# Patient Record
Sex: Male | Born: 1955 | Race: White | Hispanic: No | Marital: Married | Smoking: Never smoker
Health system: Southern US, Community
[De-identification: ages and names within clinical notes are randomized; demographics above are authoritative.]

## PROBLEM LIST (undated history)

## (undated) DIAGNOSIS — Z800 Family history of malignant neoplasm of digestive organs: Secondary | ICD-10-CM

## (undated) DIAGNOSIS — G4730 Sleep apnea, unspecified: Secondary | ICD-10-CM

## (undated) DIAGNOSIS — Z87442 Personal history of urinary calculi: Secondary | ICD-10-CM

## (undated) DIAGNOSIS — N529 Male erectile dysfunction, unspecified: Secondary | ICD-10-CM

## (undated) DIAGNOSIS — L989 Disorder of the skin and subcutaneous tissue, unspecified: Secondary | ICD-10-CM

## (undated) DIAGNOSIS — Z8719 Personal history of other diseases of the digestive system: Secondary | ICD-10-CM

## (undated) DIAGNOSIS — N401 Enlarged prostate with lower urinary tract symptoms: Secondary | ICD-10-CM

## (undated) DIAGNOSIS — I4891 Unspecified atrial fibrillation: Secondary | ICD-10-CM

## (undated) DIAGNOSIS — D369 Benign neoplasm, unspecified site: Secondary | ICD-10-CM

## (undated) DIAGNOSIS — T8859XA Other complications of anesthesia, initial encounter: Secondary | ICD-10-CM

## (undated) DIAGNOSIS — N210 Calculus in bladder: Secondary | ICD-10-CM

## (undated) DIAGNOSIS — K760 Fatty (change of) liver, not elsewhere classified: Secondary | ICD-10-CM

## (undated) DIAGNOSIS — I700 Atherosclerosis of aorta: Secondary | ICD-10-CM

## (undated) DIAGNOSIS — D1803 Hemangioma of intra-abdominal structures: Secondary | ICD-10-CM

## (undated) DIAGNOSIS — N138 Other obstructive and reflux uropathy: Secondary | ICD-10-CM

## (undated) HISTORY — PX: BACK SURGERY: SHX140

## (undated) HISTORY — PX: ESOPHAGOGASTRODUODENOSCOPY: SHX1529

---

## 2001-05-24 HISTORY — PX: BACK SURGERY: SHX140

## 2001-06-09 ENCOUNTER — Ambulatory Visit (HOSPITAL_COMMUNITY): Admission: RE | Admit: 2001-06-09 | Discharge: 2001-06-09 | Payer: Self-pay | Admitting: Pulmonary Disease

## 2001-06-26 ENCOUNTER — Ambulatory Visit (HOSPITAL_COMMUNITY): Admission: RE | Admit: 2001-06-26 | Discharge: 2001-06-27 | Payer: Self-pay | Admitting: Neurosurgery

## 2006-02-14 ENCOUNTER — Ambulatory Visit (HOSPITAL_COMMUNITY): Admission: RE | Admit: 2006-02-14 | Discharge: 2006-02-14 | Payer: Self-pay | Admitting: Internal Medicine

## 2006-02-14 ENCOUNTER — Ambulatory Visit: Payer: Self-pay | Admitting: Internal Medicine

## 2007-05-16 ENCOUNTER — Ambulatory Visit: Payer: Self-pay | Admitting: Cardiology

## 2007-05-24 ENCOUNTER — Ambulatory Visit: Payer: Self-pay

## 2010-10-06 NOTE — Assessment & Plan Note (Signed)
[facility]                            CARDIOLOGY OFFICE NOTE   NAME:Domangue, RILLEY STASH                       MRN:          [id number]  DATE:05/16/2007                            DOB:          1955-12-06    CHIEF COMPLAINT:  Chest tightness and tingling in my left arm.   HISTORY PRESENT ILLNESSES:  Mr. Tobechukwu Emmick is a delightful 55 year old  married white male from [place], who has been having some tightness  in his upper left chest that goes into his left arm.  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   He does not exercise on a regular basis.  In fact, he is exercising very  little during the winter days.  He does do yard work and does not  complain of any major exertional symptoms.   His cardiac risk factors included age, sex, and a low HDL in the 30-32  range.   PAST MEDICAL HISTORY:  he does not smoke, does not use recreational  products, does not drink.   He has no allergies.  His only medicine is a multivitamin.   SURGERY:  Ruptured disk in 2003.   FAMILY HISTORY:  Negative for any premature heart disease in first-  degree relatives.   SOCIAL HISTORY:  Is married, has no children.  He in the banking  business, and has been in [place] for 20 years.   REVIEW OF SYSTEMS:  Totally negative other than the HPI.   EXAM:  His blood pressure is 130/79.  His pulse is 72 and regular.  His  EKG is normal.  He is 6 feet 3 inches and weighs 215.  HEENT:  Normocephalic, atraumatic.  PERRL.  Extraocular is intact.  Sclerae are clear.  Face symmetry is normal.  Carotids are equal bilaterally bruits, no JVD.  Thyroid is not enlarged.  Trachea is midline.  LUNGS:  Clear.  HEART:  Reveals regular rate and rhythm with a nondisplaced PMI.  ABDOMEN:  Soft, good bowel sounds.  No midline bruit.  EXTREMITIES:  No cyanosis, clubbing or edema.  Pulses  intact.  NEURO:  Exam is intact.  Skin shows multiple cherry angiomas, otherwise negative.   ASSESSMENT:  Chest tightness with tenting in his left arm.  He has  several cardiac risk factors.  He needs to get into a regular exercise  program.   PLAN:  Exercise rest/stress Myoview next week.  He is off that week.  It  will work out well with his schedule.     Thomas C. Daleen Squibb, MD, [facility]  Electronically Signed    TCW/MedQ  DD: 05/16/2007  DT: 05/16/2007  Job #: [id number]   cc:   Ramon Dredge L. [name], M.D.

## 2010-10-09 NOTE — Op Note (Signed)
[place]  Patient:    [name], [name] Visit Number: [id number] MRN: [id number]          Service Type: DSU Location: 3000 [id number] [id number] Attending Physician:  [name] Dictated by:   [name], M.D. Proc. Date: 06/26/01 Admit Date:  06/26/2001                             Operative Report  PREOPERATIVE DIAGNOSES:  Left L4-5 herniated nucleus pulposus, degenerative disk disease, lumbago, lumbar radiculopathy.  POSTOPERATIVE DIAGNOSES:  Left L4-5 herniated nucleus pulposus, degenerative disk disease, lumbago, lumbar radiculopathy.  PROCEDURE:  Left L4-5 microdiskectomy using microdissection.  SURGEON:  [name], M.D.  ANESTHESIA:  General endotracheal.  ESTIMATED BLOOD LOSS:  Less than 100 cc.  SPECIMENS:  None.  DRAINS:  None.  COMPLICATIONS:  None.  BRIEF HISTORY:  The patient is a 55 year old white male who has suffered from back and left leg pain.  He failed medical management and was worked up with a lumbar MRI, which demonstrated a left L4-5 herniated nucleus pulposus.  The patients signs, symptoms, and physical exam were consistent with a left L5 radiculopathy.  I discussed the various treatment options with him, including surgery, and the patient weighed the risks, benefits, and alternatives of surgery and decided to proceed with a microdiskectomy.  DESCRIPTION OF PROCEDURE:  The patient was brought to the operating room by the anesthesia team.  General endotracheal anesthesia was induced.  The patient was then turned to the prone position on the Wilson frame, and his lumbosacral region was then shaved and prepared with Betadine scrub and Betadine solution.  Sterile drapes were applied.  I infiltrated the area to be incised with Marcaine with epinephrine solution and then used a scalpel to make a vertical midline incision over the L4-5 interspace.  I used electrocautery to dissect down to the  thoracolumbar fascia and divide the fascia just to the left of midline and performed a left-sided subperiosteal dissection, stripping the paraspinous musculature from the left spinous process and lamina of L4 and L5.  I inserted the McCullough retractor for exposure and then obtained the intraoperative radiograph to confirm our location.  I then brought the operating microscope into the field and under its magnification and illumination, I completed a microdissection/ decompression.  I performed a left L4 laminotomy using the high-speed drill. I then widened the laminotomy with the Kerrison punch, removing the left L4-5 ligamentum flavum.  I then performed a foraminotomy about the left L5 nerve root and then used microdissection to free up the nerve root from the epidural tissue.  I gently retracted medially with the DErrico retractor.  This exposed the underlying free-fragment herniated disk, which I removed in several fragments using the pituitary forceps.  I then used the nerve hook to prove about the annulus fibrosus.  There was a small hole where the disk had ruptured through, but I did not feel any further disk herniation, and at this point the thecal sac and nerve root were well-decompressed and I therefore did violate the annulus fibrosus.  I palpated along the ventral surface of the thecal sac and the exit route of the L5 nerve root and noted that the neural structures were well-decompressed.  I then achieved stringent hemostasis using bipolar electrocautery.  I then copiously irrigated the wound out with bacitracin solution, removed the solution, and then removed the McCullough retractor.  I  then reapproximated the patients thoracolumbar fascia with interrupted #1 Vicryl suture, the subcutaneous tissue with 0 Vicryl suture, the skin with Steri-Strips and benzoin.  The wound was then coated with bacitracin ointment and sterile dressings applied.  The drapes were removed. The  patient was subsequently returned to the supine position, where he was extubated by the anesthesia team and transported to the postanesthesia care unit in stable condition.  All sponge, instrument, and needle counts were correct at the end of the case. Dictated by:   [name], M.D. Attending Physician:  Tressie Stalker D DD:  06/26/01 TD:  06/27/01 Job: [id number]

## 2010-10-09 NOTE — Op Note (Signed)
[name], [name]                ACCOUNT NO.:  [id number]   MEDICAL RECORD NO.:  [id number]          PATIENT TYPE:  AMB   LOCATION:  DAY                           FACILITY:  [facility]   PHYSICIAN:  Lionel December, M.D.    DATE OF BIRTH:  [birth year]   DATE OF PROCEDURE:  02/14/2006  DATE OF DISCHARGE:                                 OPERATIVE REPORT   PROCEDURE:  Colonoscopy.   INDICATIONS FOR PROCEDURE:  [name] is a 55 year old Caucasian male who is  undergoing high risk screening colonoscopy.  Family history is positive for  colon carcinoma in his father.  Maternal grandmother also had colon  carcinoma.  Chadd does not have any GI symptoms.  The procedure risks were  reviewed with the patient and informed consent was obtained.   MEDICATIONS FOR CONSCIOUS SEDATION:  Demerol 50 mg IV, Versed 8 mg IV.   FINDINGS:  The procedure is performed in the endoscopy suite.  The patient's  vital signs and O2 saturations were monitored during the procedure and  remained stable.  The patient was placed in the left lateral position and  rectal examination performed.  No abnormality noted on external or digital  exam.  The Olympus videoscope was placed in the rectum and advanced under  vision in the sigmoid colon and beyond.  The preparation was excellent.  The  scope was passed to the cecum which was identified by the appendiceal  orifice and ileocecal valve.  Pictures were taken for the record.  As the  scope was withdrawn, colonic mucosa was carefully examined and there were no  mucosal abnormalities or diverticular changes noted.  The rectal mucosa,  similarly, was normal.  The scope was retroflexed to examine the anorectal  junction and small hemorrhoids noted below the dentate line.  The endoscope  was straightened and withdrawn.  The patient tolerated the procedure well.   FINAL DIAGNOSIS:  Normal colonoscopy except small external hemorrhoids.   RECOMMENDATIONS:  1. Standard instructions  given.  2. Yearly hemoccults.  3. Screening exam five years from now.      Lionel December, M.D.  Electronically Signed    NR/MEDQ  D:  02/14/2006  T:  02/15/2006  Job:  [id number]   cc:   Ramon Dredge L. [name], M.D.  Fax: [phone]

## 2011-01-26 ENCOUNTER — Encounter (INDEPENDENT_AMBULATORY_CARE_PROVIDER_SITE_OTHER): Payer: Self-pay | Admitting: *Deleted

## 2011-02-10 ENCOUNTER — Telehealth (INDEPENDENT_AMBULATORY_CARE_PROVIDER_SITE_OTHER): Payer: Self-pay | Admitting: *Deleted

## 2011-02-10 NOTE — Telephone Encounter (Signed)
Patient called today and left voice mail at 12:21pm.  Time for 5 year repeat TCS.

## 2011-02-19 NOTE — Telephone Encounter (Signed)
Called patient several times but no answer & no answering machine to leave message

## 2011-04-08 ENCOUNTER — Telehealth (INDEPENDENT_AMBULATORY_CARE_PROVIDER_SITE_OTHER): Payer: Self-pay | Admitting: *Deleted

## 2011-04-08 ENCOUNTER — Encounter (INDEPENDENT_AMBULATORY_CARE_PROVIDER_SITE_OTHER): Payer: Self-pay | Admitting: *Deleted

## 2011-04-08 ENCOUNTER — Other Ambulatory Visit (INDEPENDENT_AMBULATORY_CARE_PROVIDER_SITE_OTHER): Payer: Self-pay | Admitting: *Deleted

## 2011-04-08 DIAGNOSIS — Z800 Family history of malignant neoplasm of digestive organs: Secondary | ICD-10-CM

## 2011-04-08 MED ORDER — PEG-KCL-NACL-NASULF-NA ASC-C 100 G PO SOLR: 1.0000 | Freq: Once | ORAL | Status: DC

## 2011-04-08 NOTE — Telephone Encounter (Signed)
Patient needs movi prep 

## 2011-04-22 ENCOUNTER — Telehealth (INDEPENDENT_AMBULATORY_CARE_PROVIDER_SITE_OTHER): Payer: Self-pay | Admitting: *Deleted

## 2011-04-22 NOTE — Telephone Encounter (Signed)
PCP/Requesting MD:    Name: [name]  DOB: April 14, 2056  Home Phone: [phone]      Procedure: TCS  Reason/Indication:  SURVEILLANCE, + FH CRC  Has patient had this procedure before?  YES  If so, when, by whom and where?  2007  Is there a family history of colon cancer?  YES  Who?  What age when diagnosed?  FATHER  Is patient diabetic?   NO      Does patient have prosthetic heart valve?  NO  Do you have a pacemaker?  NO  Has patient had joint replacement within last 12 months?  NO  Is patient on Coumadin, Plavix and/or Aspirin? NO  Medications: MULTI VIT, LEVASA PRN  Allergies: NKDA  Pharmacy: [facility]  Medication Adjustment: NONE  Procedure date & time: 05/13/11 @ 3:00

## 2011-04-26 NOTE — Telephone Encounter (Signed)
agree

## 2011-05-23 ENCOUNTER — Observation Stay (HOSPITAL_COMMUNITY)
Admission: EM | Admit: 2011-05-23 | Discharge: 2011-05-24 | DRG: 143 | Disposition: A | Discharge status: Home / Self Care | Payer: BC Managed Care – PPO | Attending: Pulmonary Disease | Admitting: Pulmonary Disease

## 2011-05-23 ENCOUNTER — Other Ambulatory Visit: Payer: Self-pay

## 2011-05-23 ENCOUNTER — Emergency Department (HOSPITAL_COMMUNITY): Payer: BC Managed Care – PPO

## 2011-05-23 DIAGNOSIS — E785 Hyperlipidemia, unspecified: Secondary | ICD-10-CM | POA: Diagnosis present

## 2011-05-23 DIAGNOSIS — R0789 Other chest pain: Principal | ICD-10-CM | POA: Diagnosis present

## 2011-05-23 LAB — CBC
HCT: 42.9 % (ref 39.0–52.0)
Hemoglobin: 14.7 g/dL (ref 13.0–17.0)
MCV: 87.2 fL (ref 78.0–100.0)
RBC: 4.92 MIL/uL (ref 4.22–5.81)
WBC: 8.1 10*3/uL (ref 4.0–10.5)

## 2011-05-23 LAB — COMPREHENSIVE METABOLIC PANEL
AST: 20 U/L (ref 0–37)
CO2: 27 mEq/L (ref 19–32)
Calcium: 9.7 mg/dL (ref 8.4–10.5)
Chloride: 103 mEq/L (ref 96–112)
Creatinine, Ser: 1.11 mg/dL (ref 0.50–1.35)
GFR calc Af Amer: 85 mL/min — ABNORMAL LOW (ref >90)
GFR calc non Af Amer: 73 mL/min — ABNORMAL LOW (ref >90)
Glucose, Bld: 174 mg/dL — ABNORMAL HIGH (ref 70–99)
Total Bilirubin: 0.5 mg/dL (ref 0.3–1.2)

## 2011-05-23 LAB — MRSA PCR SCREENING: MRSA by PCR: NEGATIVE

## 2011-05-23 LAB — DIFFERENTIAL
Eosinophils Relative: 2 % (ref 0–5)
Lymphocytes Relative: 25 % (ref 12–46)
Lymphs Abs: 2 10*3/uL (ref 0.7–4.0)
Monocytes Absolute: 0.6 10*3/uL (ref 0.1–1.0)
Monocytes Relative: 7 % (ref 3–12)
Neutro Abs: 5.3 10*3/uL (ref 1.7–7.7)

## 2011-05-23 LAB — POCT I-STAT TROPONIN I

## 2011-05-23 MED ORDER — NITROGLYCERIN 0.4 MG SL SUBL
0.4000 mg | SUBLINGUAL_TABLET | SUBLINGUAL | Status: DC | PRN
  Administered 2011-05-23: 0.4 mg via SUBLINGUAL
  Filled 2011-05-23: qty 25

## 2011-05-23 MED ORDER — ENOXAPARIN SODIUM 40 MG/0.4ML ~~LOC~~ SOLN
40.0000 mg | SUBCUTANEOUS | Status: DC
  Administered 2011-05-23: 40 mg via SUBCUTANEOUS
  Filled 2011-05-23 (×3): qty 0.4

## 2011-05-23 MED ORDER — TRAMADOL HCL 50 MG PO TABS
50.0000 mg | ORAL_TABLET | Freq: Four times a day (QID) | ORAL | Status: DC | PRN
  Administered 2011-05-23: 50 mg via ORAL
  Filled 2011-05-23: qty 1

## 2011-05-23 MED ORDER — ASPIRIN 81 MG PO CHEW
81.0000 mg | CHEWABLE_TABLET | Freq: Every day | ORAL | Status: DC
  Administered 2011-05-24: 81 mg via ORAL
  Filled 2011-05-23: qty 1

## 2011-05-23 MED ORDER — SIMVASTATIN 20 MG PO TABS
20.0000 mg | ORAL_TABLET | Freq: Every day | ORAL | Status: DC
  Filled 2011-05-23 (×2): qty 1

## 2011-05-23 MED ORDER — LORAZEPAM 0.5 MG PO TABS
0.5000 mg | ORAL_TABLET | Freq: Four times a day (QID) | ORAL | Status: DC | PRN
  Administered 2011-05-24: 0.5 mg via ORAL
  Filled 2011-05-23: qty 1

## 2011-05-23 MED ORDER — SODIUM CHLORIDE 0.9 % IV SOLN
INTRAVENOUS | Status: DC
  Administered 2011-05-23: 20:00:00 via INTRAVENOUS

## 2011-05-23 MED ORDER — ASPIRIN 81 MG PO CHEW
324.0000 mg | CHEWABLE_TABLET | Freq: Once | ORAL | Status: AC
  Administered 2011-05-23: 324 mg via ORAL
  Filled 2011-05-23: qty 4

## 2011-05-23 MED ORDER — OXYCODONE-ACETAMINOPHEN 5-325 MG PO TABS
1.0000 | ORAL_TABLET | Freq: Once | ORAL | Status: AC
  Administered 2011-05-23: 1 via ORAL
  Filled 2011-05-23: qty 1

## 2011-05-23 NOTE — ED Provider Notes (Signed)
History     CSN: [id number]  Arrival date & time 05/23/11  1337   First MD Initiated Contact with Patient 05/23/11 1349      Chief Complaint  Patient presents with  . Chest Pain    (Consider location/radiation/quality/duration/timing/severity/associated sxs/prior treatment) Patient is a 55 y.o. male presenting with chest pain. The history is provided by the patient (the patient states he has been having chest pain off and on for a couple days now but it has been more constant since yesterday , the patient states that he has had some sweating and shortness of breath).  Chest Pain The chest pain began 12 - 24 hours ago. Chest pain occurs constantly. The chest pain is unchanged. Associated with: lying down. At its most intense, the pain is at 7/10. The pain is currently at 3/10. The quality of the pain is described as aching. The pain does not radiate. Chest pain is worsened by certain positions. Pertinent negatives for primary symptoms include no fever, no fatigue, no cough and no abdominal pain.  Associated symptoms include diaphoresis.  Pertinent negatives for associated symptoms include no claudication. He tried nothing for the symptoms. Risk factors include no known risk factors.  Pertinent negatives for past medical history include no aneurysm and no seizures.     History reviewed. No pertinent past medical history.  Past Surgical History  Procedure Date  . Back surgery     No family history on file.  History  Substance Use Topics  . Smoking status: Never Smoker   . Smokeless tobacco: Not on file  . Alcohol Use: No      Review of Systems  Constitutional: Positive for diaphoresis. Negative for fever and fatigue.  HENT: Negative for congestion, sinus pressure and ear discharge.   Eyes: Negative for discharge.  Respiratory: Negative for cough.   Cardiovascular: Positive for chest pain. Negative for claudication.  Gastrointestinal: Negative for abdominal pain and  diarrhea.  Genitourinary: Negative for frequency and hematuria.  Musculoskeletal: Negative for back pain.  Skin: Negative for rash.  Neurological: Negative for seizures and headaches.  Hematological: Negative.   Psychiatric/Behavioral: Negative for hallucinations.    Allergies  Review of patient's allergies indicates no known allergies.  Home Medications   Current Outpatient Rx  Name Route Sig Dispense Refill  . PEG-KCL-NACL-NASULF-NA ASC-C 100 G PO SOLR Oral Take 1 kit (100 g total) by mouth once. 1 kit 0    BP 122/70  Pulse 104  Temp(Src) 98.3 F (36.8 C) (Oral)  Resp 20  Ht 6\' 3"  (1.905 m)  Wt 225 lb (102.059 kg)  BMI 28.12 kg/m2  SpO2 96%  Physical Exam  Constitutional: He is oriented to person, place, and time. He appears well-developed.  HENT:  Head: Normocephalic and atraumatic.  Eyes: Conjunctivae and EOM are normal. No scleral icterus.  Neck: Neck supple. No thyromegaly present.  Cardiovascular: Normal rate and regular rhythm.  Exam reveals no gallop and no friction rub.   No murmur heard. Pulmonary/Chest: No stridor. He has no wheezes. He has no rales. He exhibits no tenderness.  Abdominal: He exhibits no distension. There is no tenderness. There is no rebound.  Musculoskeletal: Normal range of motion. He exhibits no edema.  Lymphadenopathy:    He has no cervical adenopathy.  Neurological: He is oriented to person, place, and time. Coordination normal.  Skin: No rash noted. No erythema.  Psychiatric: He has a normal mood and affect. His behavior is normal.    ED Course  Procedures (including critical care time)  Labs Reviewed  COMPREHENSIVE METABOLIC PANEL - Abnormal; Notable for the following:    Glucose, Bld 174 (*)    GFR calc non Af Amer 73 (*)    GFR calc Af Amer 85 (*)    All other components within normal limits  CBC  DIFFERENTIAL  POCT I-STAT TROPONIN I  I-STAT TROPONIN I   Dg Chest 2 View  05/23/2011  *RADIOLOGY REPORT*  Clinical Data:  Chest pain, shortness of breath  CHEST - 2 VIEW  Comparison: None.  Findings: Cardiomediastinal silhouette is unremarkable.  Mild elevation of the left hemidiaphragm.  Moderate gaseous distention of proximal stomach.  Mild left basilar atelectasis.  No acute infiltrate or pulmonary edema.  IMPRESSION: Mild left basilar atelectasis.  No acute infiltrate or pulmonary edema.  Moderate gaseous distention of proximal stomach.  Original Report Authenticated By: Natasha Mead, M.D.     1. Chest pain     Date: 05/23/2011  Rate: 80  Rhythm: normal sinus rhythm  QRS Axis: normal  Intervals: normal  ST/T Wave abnormalities: nonspecific ST changes  Conduction Disutrbances:none  Narrative Interpretation:   Old EKG Reviewed: none available     MDM          Benny Lennert, MD 05/23/11 [phone]

## 2011-05-23 NOTE — ED Notes (Signed)
Pt presents with left sided chest pain that radiates to left shoulder. Pt states pain started 2 days ago. Pt denies anything worsening pain.

## 2011-05-23 NOTE — H&P (Signed)
[name], [name]                ACCOUNT NO.:  [id number]  MEDICAL RECORD NO.:  [id number]  LOCATION:  IC03                          FACILITY:  [facility]  PHYSICIAN:  Melvyn Novas, MDDATE OF BIRTH:  [birth year]  DATE OF ADMISSION:  05/23/2011 DATE OF DISCHARGE:  LH                             HISTORY & PHYSICAL   The patient is 55 year old white married male, patient of Dr. [name], who has risk factor for coronary disease, hyperlipidemia, questionably compliant with Lovaza.  Basically complains of 3 night history of left arm aching and pain, which progressed to 36 hours ago, left-sided pectoral pain which was occurring at rest, questionable mild pleuritic component, but patient denies any exertional component.  The patient was seen in the ER by Dr. Estell Harpin, given sublingual nitroglycerin and partial relief.  EKG and enzymes were essentially unremarkable, however due to one risk factor and questionable borderline hypothyroidism, we will admit and do serial enzymes and consider functional evaluation in a day or so.  The patient admits to some associated nausea with the chest pain, but denies diaphoresis, palpitations, dizziness, or syncope.  PAST MEDICAL HISTORY:  Significant for hyperlipidemia, borderline elevation of TSH, and herniated nucleus pulposus of lumbosacral spine.  PAST SURGICAL HISTORY:  Remarkable for laminectomy of lumbosacral spine.  He has no known allergies.  His current medicines are Lovaza, unknown dosage once a day.  SOCIAL HISTORY:  He is married, works in a bank.  Does not smoke. Occasionally imbibes alcohol.  REVIEW OF SYSTEMS:  Negative for polyuria, polydipsia, weight loss, seizures, tremors, syncope, orthopnea, or PND.  PHYSICAL EXAMINATION:  VITAL SIGNS:  Blood pressure at present is 122/70, temperature 98.3, pulse is 104 and regular, respiratory rate is 20, O2 sat is 96%. EYES:  PERRLA intact.  Sclerae clear.  Conjunctivae pink.  NECK:   No JVD.  No carotid bruits.  No thyromegaly.  No thyroid bruits. LUNGS:  Clear to A and P.  No rales, wheeze, or rhonchi appreciable. HEART:  Regular rhythm.  No S3, S4, or gallops.  No heaves, thrills, or rubs. ABDOMEN:  Soft, nontender.  Bowel sounds normoactive.  No guarding, rebound, mass, or hepatosplenomegaly.  EXTREMITIES:  No clubbing, cyanosis, or edema. NEUROLOGIC:  Cranial nerves II-XII grossly intact.  Patient moves all 4 extremities.  Chest x-ray reveals mild left basilar atelectasis.  No acute infiltrate.  WBC 8.1, hemoglobin 14.7.  The plan at present is to add aspirin 81 mg per day, add Lovenox, add simvastatin 20 mg p.o. now.  We will get serial cardiac enzymes q.8 hours x3, EKG in a.m., Cardiology consult for possible early functional evaluation if they deem clinically necessary.  He will be admitted to the ICU.     Melvyn Novas, MD     RMD/MEDQ  D:  05/23/2011  T:  05/23/2011  Job:  [id number]

## 2011-05-23 NOTE — H&P (Signed)
[id number] 

## 2011-05-24 DIAGNOSIS — R072 Precordial pain: Secondary | ICD-10-CM

## 2011-05-24 DIAGNOSIS — R079 Chest pain, unspecified: Secondary | ICD-10-CM

## 2011-05-24 LAB — CARDIAC PANEL(CRET KIN+CKTOT+MB+TROPI)
CK, MB: 2.2 ng/mL (ref 0.3–4.0)
Relative Index: 1.8 (ref 0.0–2.5)
Relative Index: 2 (ref 0.0–2.5)
Total CK: 137 U/L (ref 7–232)
Troponin I: <0.3 ng/mL (ref <0.30)

## 2011-05-24 LAB — GLUCOSE, CAPILLARY
Glucose-Capillary: 94 mg/dL (ref 70–99)
Glucose-Capillary: 71 mg/dL (ref 70–99)

## 2011-05-24 MED ORDER — NAPROXEN SODIUM 220 MG PO TABS: 220.0000 mg | ORAL_TABLET | Freq: Two times a day (BID) | ORAL | Status: AC

## 2011-05-24 MED ORDER — ENOXAPARIN SODIUM 60 MG/0.6ML ~~LOC~~ SOLN: 50.0000 mg | SUBCUTANEOUS | Status: DC

## 2011-05-24 MED ORDER — ONDANSETRON HCL 4 MG/2ML IJ SOLN
4.0000 mg | Freq: Four times a day (QID) | INTRAMUSCULAR | Status: DC | PRN
  Administered 2011-05-24: 4 mg via INTRAVENOUS
  Filled 2011-05-24: qty 2

## 2011-05-24 MED ORDER — KETOROLAC TROMETHAMINE 60 MG/2ML IM SOLN
60.0000 mg | Freq: Once | INTRAMUSCULAR | Status: AC
  Administered 2011-05-24: 60 mg via INTRAMUSCULAR

## 2011-05-24 MED ORDER — NAPROXEN 250 MG PO TABS: 375.0000 mg | ORAL_TABLET | Freq: Three times a day (TID) | ORAL | Status: DC

## 2011-05-24 MED ORDER — KETOROLAC TROMETHAMINE 30 MG/ML IJ SOLN
INTRAMUSCULAR | Status: AC
  Administered 2011-05-24: 60 mg
  Filled 2011-05-24: qty 2

## 2011-05-24 MED ORDER — LORAZEPAM 0.5 MG PO TABS: 1.0000 mg | ORAL_TABLET | Freq: Every evening | ORAL | Status: DC | PRN

## 2011-05-24 NOTE — Progress Notes (Signed)
Discharge instructions given. PT STATES THAT SHARP CHEST PAIN IS RELIEVED. IV D/C'D. VITAL SIGNS ARE STABLE. DISCHARGED HOME ACCOMPAINED BY WIFE. TRANSPORTED IN W/C.

## 2011-05-24 NOTE — Consult Note (Signed)
Patient Name: [name]  MRN: [id number]  HPI: DELSIN COPEN is an 55 y.o. male referred for consultation by Dr.Edward Patrice Paradise, MD for chest pain.   This nice gentleman has enjoyed generally excellent health. His only cardiovascular risk factor is hyperlipidemia that has been treated pharmacologically.  He was evaluated by Dr. Daleen Squibb 5 years ago with a stress test that was reportedly negative when he was experiencing chest discomfort. Activity level is not high, but he experiences no dyspnea nor chest discomfort with yard work or other similar exertion.  Onset of chest discomfort was approximately 36 hours before dimension. It has waxed and waned, but remained present virtually throughout. Quality is pressure like and sharp. Intensity has been mild to moderate. There is a pleuritic component and some chest wall tenderness. Patient reports no recent unaccustomed activity or apparent injury. He has not used over-the-counter analgesics. He has not identified anything that improves discomfort. Movement of the trunk exacerbates it.  History reviewed. No pertinent past medical history except as noted above.  Past Surgical History  Procedure Date  . Back surgery     History reviewed. No pertinent family history.  Social History:  reports that he has never smoked. He does not have any smokeless tobacco history on file. He reports that he does not drink alcohol or use illicit drugs.  Allergies: No Known Allergies  Medications: I have reviewed the patient's current medications.  Results for orders placed during the hospital encounter of 05/23/11 (from the past 48 hour(s))  CBC     Status: Normal   Collection Time   05/23/11  2:25 PM      Component Value Range Comment   WBC 8.1  4.0 - 10.5 (K/uL)    RBC 4.92  4.22 - 5.81 (MIL/uL)    Hemoglobin 14.7  13.0 - 17.0 (g/dL)    HCT 40.9  81.1 - 91.4 (%)    MCV 87.2  78.0 - 100.0 (fL)    MCH 29.9  26.0 - 34.0 (pg)    MCHC 34.3  30.0 - 36.0 (g/dL)     RDW 78.2  95.6 - 21.3 (%)    Platelets 231  150 - 400 (K/uL)   DIFFERENTIAL     Status: Normal   Collection Time   05/23/11  2:25 PM      Component Value Range Comment   Neutrophils Relative 65  43 - 77 (%)    Neutro Abs 5.3  1.7 - 7.7 (K/uL)    Lymphocytes Relative 25  12 - 46 (%)    Lymphs Abs 2.0  0.7 - 4.0 (K/uL)    Monocytes Relative 7  3 - 12 (%)    Monocytes Absolute 0.6  0.1 - 1.0 (K/uL)    Eosinophils Relative 2  0 - 5 (%)    Eosinophils Absolute 0.2  0.0 - 0.7 (K/uL)    Basophils Relative 0  0 - 1 (%)    Basophils Absolute 0.0  0.0 - 0.1 (K/uL)   COMPREHENSIVE METABOLIC PANEL     Status: Abnormal   Collection Time   05/23/11  2:25 PM      Component Value Range Comment   Sodium 140  135 - 145 (mEq/L)    Potassium 3.6  3.5 - 5.1 (mEq/L)    Chloride 103  96 - 112 (mEq/L)    CO2 27  19 - 32 (mEq/L)    Glucose, Bld 174 (*) 70 - 99 (mg/dL)    BUN  16  6 - 23 (mg/dL)    Creatinine, Ser 0.45  0.50 - 1.35 (mg/dL)    Calcium 9.7  8.4 - 10.5 (mg/dL)    Total Protein 7.4  6.0 - 8.3 (g/dL)    Albumin 3.8  3.5 - 5.2 (g/dL)    AST 20  0 - 37 (U/L)    ALT 27  0 - 53 (U/L)    Alkaline Phosphatase 67  39 - 117 (U/L)    Total Bilirubin 0.5  0.3 - 1.2 (mg/dL)    GFR calc non Af Amer 73 (*) >90 (mL/min)    GFR calc Af Amer 85 (*) >90 (mL/min)   POCT I-STAT TROPONIN I     Status: Normal   Collection Time   05/23/11  2:31 PM      Component Value Range Comment   Troponin i, poc 0.00  0.00 - 0.08 (ng/mL)    Comment 3            MRSA PCR SCREENING     Status: Normal   Collection Time   05/23/11  7:20 PM      Component Value Range Comment   MRSA by PCR NEGATIVE  NEGATIVE    CARDIAC PANEL(CRET KIN+CKTOT+MB+TROPI)     Status: Normal   Collection Time   05/24/11 12:18 AM      Component Value Range Comment   Total CK 137  7 - 232 (U/L)    CK, MB 2.4  0.3 - 4.0 (ng/mL)    Troponin I <0.30  <0.30 (ng/mL)    Relative Index 1.8  0.0 - 2.5    GLUCOSE, CAPILLARY     Status: Normal    Collection Time   05/24/11  7:21 AM      Component Value Range Comment   Glucose-Capillary 94  70 - 99 (mg/dL)   CARDIAC PANEL(CRET KIN+CKTOT+MB+TROPI)     Status: Normal   Collection Time   05/24/11  7:46 AM      Component Value Range Comment   Total CK 112  7 - 232 (U/L)    CK, MB 2.2  0.3 - 4.0 (ng/mL)    Troponin I <0.30  <0.30 (ng/mL)    Relative Index 2.0  0.0 - 2.5    D-DIMER, QUANTITATIVE     Status: Normal   Collection Time   05/24/11  7:46 AM      Component Value Range Comment   D-Dimer, Quant 0.39  0.00 - 0.48 (ug/mL-FEU)   GLUCOSE, CAPILLARY     Status: Normal   Collection Time   05/24/11 11:13 AM      Component Value Range Comment   Glucose-Capillary 71  70 - 99 (mg/dL)    EKG: Normal sinus rhythm. Delayed R-wave progression, otherwise within normal limits.  Dg Chest 2 View  05/23/2011  *RADIOLOGY REPORT*  Clinical Data: Chest pain, shortness of breath  CHEST - 2 VIEW  Comparison: None.  Findings: Cardiomediastinal silhouette is unremarkable.  Mild elevation of the left hemidiaphragm.  Moderate gaseous distention of proximal stomach.  Mild left basilar atelectasis.  No acute infiltrate or pulmonary edema.  IMPRESSION: Mild left basilar atelectasis.  No acute infiltrate or pulmonary edema.  Moderate gaseous distention of proximal stomach.  Original Report Authenticated By: Natasha Mead, M.D.    Review of Systems: General: no anorexia, weight gain or weight loss Cardiac: no chest pain, dyspnea, orthopnea, PND,  or syncope Respiratory: no cough, sputum production or hemoptysis GI: no nausea, abdominal pain,  emesis, diarrhea or constipation Integument: no significant lesions Neurologic: No muscle weakness or paralysis; no speech disturbance; no headache  Physical Exam: Blood pressure 120/69, pulse 73, temperature 98 F (36.7 C), temperature source Oral, resp. rate 18, height 6\' 3"  (1.905 m), weight 103 kg (227 lb 1.2 oz), SpO2 95.00%.  General-Well-developed; no acute  distress HEENT-J[place]/AT; PERRL; EOM intact; conjunctiva and lids nl Neck-No JVD; no carotid bruits Endocrine-No thyromegaly Lungs-Clear lung fields; resonant percussion; normal I-to-E ratio Cardiovascular- normal PMI; normal S1 and S2 Abdomen-BS normal; soft and non-tender without masses or organomegaly Musculoskeletal-No deformities, cyanosis or clubbing Neurologic-Nl cranial nerves; symmetric strength and tone Skin- Warm, no significant lesions Extremities-Nl distal pulses; no edema  Assessment/Plan:  Pain is pleuritic and consistent with a musculoskeletal etiology.  Discomfort has no characteristics to suggest a myocardial origin.  Echocardiogram shows no segmental wall motion abnormality, so the EKG findings suggestive of possible septal myocardial infarction are likely related to lead placement or cardiac position within the chest. With a normal d-dimer, pulmonary embolism is highly unlikely. Nonsteroidal analgesics will be added to his medical regime. From a cardiac standpoint, he does not require additional time in hospital.  Evant Bing, MD 05/24/2011, 1:02 PM

## 2011-05-24 NOTE — Progress Notes (Signed)
Subjective: He was brought in last night with chest discomfort. This is been going on for several days. Initially it was pressure-like and aching but has become more pleuritic as time has gone. It is somewhat positional. He has not had any exertional angina. He has had a couple of episodes similar to this in the past but not as severe and not as persistent.  Objective: Vital signs in last 24 hours: Temp:  [98 F (36.7 C)-98.5 F (36.9 C)] 98 F (36.7 C) (12/31 0400) Pulse Rate:  [67-104] 73  (12/31 0600) Resp:  [14-26] 18  (12/31 0600) BP: (102-131)/(55-88) 120/69 mmHg (12/31 0600) SpO2:  [94 %-99 %] 95 % (12/31 0600) Weight:  [102.059 kg (225 lb)-103 kg (227 lb 1.2 oz)] 227 lb 1.2 oz (103 kg) (12/31 0500) Weight change:  Last BM Date: 05/22/11  Intake/Output from previous day: 12/30 0701 - 12/31 0700 In: 1248.3 [P.O.:240; I.V.:1008.3] Out: -   PHYSICAL EXAM General appearance: alert, cooperative and no distress Resp: clear to auscultation bilaterally Cardio: regular rate and rhythm, S1, S2 normal, no murmur, click, rub or gallop GI: soft, non-tender; bowel sounds normal; no masses,  no organomegaly Extremities: extremities normal, atraumatic, no cyanosis or edema  Lab Results:    Basic Metabolic Panel:  Basename 05/23/11 1425  NA 140  K 3.6  CL 103  CO2 27  GLUCOSE 174*  BUN 16  CREATININE 1.11  CALCIUM 9.7  MG --  PHOS --   Liver Function Tests:  Basename 05/23/11 1425  AST 20  ALT 27  ALKPHOS 67  BILITOT 0.5  PROT 7.4  ALBUMIN 3.8   No results found for this basename: LIPASE:2,AMYLASE:2 in the last 72 hours No results found for this basename: AMMONIA:2 in the last 72 hours CBC:  Basename 05/23/11 1425  WBC 8.1  NEUTROABS 5.3  HGB 14.7  HCT 42.9  MCV 87.2  PLT 231   Cardiac Enzymes:  Basename 05/24/11 0018  CKTOTAL 137  CKMB 2.4  CKMBINDEX --  TROPONINI <0.30   BNP: No results found for this basename: PROBNP:3 in the last 72  hours D-Dimer: No results found for this basename: DDIMER:2 in the last 72 hours CBG: No results found for this basename: GLUCAP:6 in the last 72 hours Hemoglobin A1C: No results found for this basename: HGBA1C in the last 72 hours Fasting Lipid Panel: No results found for this basename: CHOL,HDL,LDLCALC,TRIG,CHOLHDL,LDLDIRECT in the last 72 hours Thyroid Function Tests: No results found for this basename: TSH,T4TOTAL,FREET4,T3FREE,THYROIDAB in the last 72 hours Anemia Panel: No results found for this basename: VITAMINB12,FOLATE,FERRITIN,TIBC,IRON,RETICCTPCT in the last 72 hours Coagulation: No results found for this basename: LABPROT:2,INR:2 in the last 72 hours Urine Drug Screen: Drugs of Abuse  No results found for this basename: labopia, cocainscrnur, labbenz, amphetmu, thcu, labbarb    Alcohol Level: No results found for this basename: ETH:2 in the last 72 hours Urinalysis:  Misc. Labs:  ABGS No results found for this basename: PHART,PCO2,PO2ART,TCO2,HCO3 in the last 72 hours CULTURES Recent Results (from the past 240 hour(s))  MRSA PCR SCREENING     Status: Normal   Collection Time   05/23/11  7:20 PM      Component Value Range Status Comment   MRSA by PCR NEGATIVE  NEGATIVE  Final    Studies/Results: Dg Chest 2 View  05/23/2011  *RADIOLOGY REPORT*  Clinical Data: Chest pain, shortness of breath  CHEST - 2 VIEW  Comparison: None.  Findings: Cardiomediastinal silhouette is unremarkable.  Mild  elevation of the left hemidiaphragm.  Moderate gaseous distention of proximal stomach.  Mild left basilar atelectasis.  No acute infiltrate or pulmonary edema.  IMPRESSION: Mild left basilar atelectasis.  No acute infiltrate or pulmonary edema.  Moderate gaseous distention of proximal stomach.  Original Report Authenticated By: Natasha Mead, M.D.    Medications:  Prior to Admission:  Prescriptions prior to admission  Medication Sig Dispense Refill  . peg 3350 powder (MOVIPREP) 100  G SOLR Take 1 kit by mouth once. Colonoscopy scheduled for January...        Scheduled:   . aspirin  324 mg Oral Once  . aspirin  81 mg Oral Daily  . enoxaparin (LOVENOX) injection  40 mg Subcutaneous Q24H  . oxyCODONE-acetaminophen  1 tablet Oral Once  . simvastatin  20 mg Oral q1800   Continuous:   . sodium chloride 100 mL/hr at 05/24/11 0600   OZH:YQMVHQION, LORazepam, nitroGLYCERIN, ondansetron (ZOFRAN) IV, traMADol  Assesment: He has had chest pain with some atypical features but also some worrisome features for cardiac disease. He is still having pain and thus far his cardiac enzymes are negative. Active Problems:  * No active hospital problems. *     Plan: I will plan for him to have cardiology consultation. I'm going to have him get a d-dimer continue his other treatments.    LOS: 1 day   Tarrin Lebow L 05/24/2011, 7:42 AM

## 2011-05-24 NOTE — Progress Notes (Signed)
*  PRELIMINARY RESULTS* Echocardiogram 2D Echocardiogram has been performed.  [name] 05/24/2011, 11:37 AM

## 2011-05-26 NOTE — Discharge Summary (Signed)
Physician Discharge Summary  Patient ID: [name] MRN: [id number] DOB/AGE: [birth year] 56 y.o. Primary Care Physician:No primary provider on file. Admit date: 05/23/2011 Discharge date: 05/26/2011    Discharge Diagnoses:  Chest pain myocardial infarction ruled out Hyperlipidemia Active Problems:  * No active hospital problems. *    Discharge Medication List as of 05/24/2011  2:36 PM    START taking these medications   Details  naproxen sodium (ALEVE) 220 MG tablet Take 1 tablet (220 mg total) by mouth 2 (two) times daily with a meal., Starting 05/24/2011, Until Tue 05/23/12, OTC      STOP taking these medications     peg 3350 powder (MOVIPREP) 100 G SOLR         Discharged Condition: Improved    Consults: Cardiology  Significant Diagnostic Studies: Dg Chest 2 View  05/23/2011  *RADIOLOGY REPORT*  Clinical Data: Chest pain, shortness of breath  CHEST - 2 VIEW  Comparison: None.  Findings: Cardiomediastinal silhouette is unremarkable.  Mild elevation of the left hemidiaphragm.  Moderate gaseous distention of proximal stomach.  Mild left basilar atelectasis.  No acute infiltrate or pulmonary edema.  IMPRESSION: Mild left basilar atelectasis.  No acute infiltrate or pulmonary edema.  Moderate gaseous distention of proximal stomach.  Original Report Authenticated By: Natasha Mead, M.D.    Lab Results: Basic Metabolic Panel:  Basename 05/23/11 1425  NA 140  K 3.6  CL 103  CO2 27  GLUCOSE 174*  BUN 16  CREATININE 1.11  CALCIUM 9.7  MG --  PHOS --   Liver Function Tests:  Basename 05/23/11 1425  AST 20  ALT 27  ALKPHOS 67  BILITOT 0.5  PROT 7.4  ALBUMIN 3.8     CBC:  Basename 05/23/11 1425  WBC 8.1  NEUTROABS 5.3  HGB 14.7  HCT 42.9  MCV 87.2  PLT 231    Recent Results (from the past 240 hour(s))  MRSA PCR SCREENING     Status: Normal   Collection Time   05/23/11  7:20 PM      Component Value Range Status Comment   MRSA by PCR NEGATIVE   NEGATIVE  Final      Hospital Course: He was brought in because of chest pain that had typical and atypical features. He ruled out for myocardial infarction. He has consultation with the cardiology team and they felt that his pain was not cardiac in nature and that he could be treated as an outpatient and have further evaluations and outpatient.  Discharge Exam: Blood pressure 131/80, pulse 77, temperature 98 F (36.7 C), temperature source Oral, resp. rate 23, height 6\' 3"  (1.905 m), weight 103 kg (227 lb 1.2 oz), SpO2 97.00%. His chest was clear heart regular abdomen soft  Disposition: Home  Discharge Orders    Future Orders Please Complete By Expires   Discharge patient           Signed: Fredirick Maudlin Pager [phone]  05/26/2011, 7:31 AM

## 2011-06-01 ENCOUNTER — Telehealth (INDEPENDENT_AMBULATORY_CARE_PROVIDER_SITE_OTHER): Payer: Self-pay | Admitting: *Deleted

## 2011-06-01 NOTE — Telephone Encounter (Signed)
agree

## 2011-06-01 NOTE — Telephone Encounter (Signed)
Requesting MD:  [name]   PCP: [name]   Name: [name]  DOB: 10/11/2055   Procedure: TCS  Reason/Indication:  Surveillance, + FH CR   Has patient had this procedure before?  yes  If so, when, by whom and where?  2007  Is there a family history of colon cancer?  yes  Who?  What age when diagnosed?  father  Is patient diabetic?   no      Does patient have prosthetic heart valve?  no  Do you have a pacemaker?  no  Has patient had joint replacement within last 12 months?  no  Is patient on Coumadin, Plavix and/or Aspirin? no  Medications: multi vit, Levasa prn  Allergies: NKDA  Pharmacy: [facility]  Medication Adjustment: none  Other:   Procedure date & time: 06/23/11 @ 830

## 2011-06-01 NOTE — Telephone Encounter (Signed)
LM asking to speak with the person that files out the forms to send to insurance company after a tcs. The return phone number is [phone].

## 2011-06-15 ENCOUNTER — Encounter (HOSPITAL_COMMUNITY): Payer: Self-pay | Admitting: Pharmacy Technician

## 2011-06-16 ENCOUNTER — Encounter (INDEPENDENT_AMBULATORY_CARE_PROVIDER_SITE_OTHER): Payer: Self-pay | Admitting: *Deleted

## 2011-06-22 MED ORDER — SODIUM CHLORIDE 0.45 % IV SOLN
Freq: Once | INTRAVENOUS | Status: AC
Start: 2011-06-22 — End: 2011-06-23
  Administered 2011-06-23: 1000 mL via INTRAVENOUS

## 2011-06-23 ENCOUNTER — Other Ambulatory Visit (INDEPENDENT_AMBULATORY_CARE_PROVIDER_SITE_OTHER): Payer: Self-pay | Admitting: Internal Medicine

## 2011-06-23 ENCOUNTER — Encounter (HOSPITAL_COMMUNITY)
Admission: RE | Disposition: A | Discharge status: Home / Self Care | Payer: Self-pay | Source: Ambulatory Visit | Attending: Internal Medicine

## 2011-06-23 ENCOUNTER — Encounter (HOSPITAL_COMMUNITY): Payer: Self-pay | Admitting: *Deleted

## 2011-06-23 ENCOUNTER — Ambulatory Visit (HOSPITAL_COMMUNITY)
Admission: RE | Admit: 2011-06-23 | Discharge: 2011-06-23 | Disposition: A | Discharge status: Home / Self Care | Payer: BC Managed Care – PPO | Source: Ambulatory Visit | Attending: Internal Medicine | Admitting: Internal Medicine

## 2011-06-23 DIAGNOSIS — D126 Benign neoplasm of colon, unspecified: Secondary | ICD-10-CM | POA: Insufficient documentation

## 2011-06-23 DIAGNOSIS — Z800 Family history of malignant neoplasm of digestive organs: Secondary | ICD-10-CM | POA: Insufficient documentation

## 2011-06-23 DIAGNOSIS — Z7982 Long term (current) use of aspirin: Secondary | ICD-10-CM | POA: Insufficient documentation

## 2011-06-23 DIAGNOSIS — Z1211 Encounter for screening for malignant neoplasm of colon: Secondary | ICD-10-CM

## 2011-06-23 HISTORY — PX: COLONOSCOPY: SHX5424

## 2011-06-23 SURGERY — COLONOSCOPY
Anesthesia: Moderate Sedation

## 2011-06-23 MED ORDER — MEPERIDINE HCL 50 MG/ML IJ SOLN
INTRAMUSCULAR | Status: AC
  Filled 2011-06-23: qty 1

## 2011-06-23 MED ORDER — MIDAZOLAM HCL 5 MG/5ML IJ SOLN
INTRAMUSCULAR | Status: DC | PRN
  Administered 2011-06-23 (×3): 2 mg via INTRAVENOUS

## 2011-06-23 MED ORDER — MEPERIDINE HCL 50 MG/ML IJ SOLN
INTRAMUSCULAR | Status: DC | PRN
  Administered 2011-06-23 (×2): 25 mg via INTRAVENOUS

## 2011-06-23 MED ORDER — MIDAZOLAM HCL 5 MG/5ML IJ SOLN
INTRAMUSCULAR | Status: AC
  Filled 2011-06-23: qty 10

## 2011-06-23 MED ORDER — STERILE WATER FOR IRRIGATION IR SOLN
Status: DC | PRN
  Administered 2011-06-23: 08:00:00

## 2011-06-23 NOTE — H&P (Signed)
[name] is an 56 y.o. male.   Chief Complaint: Patient is here for colonoscopy. HPI: Patient is 56 year old Caucasian male who is in high-risk screening colonoscopy. He denies rectal bleeding, change in his bowel habits or abdominal pain. His last endoscopy was in September 2007 and was normal. Family history is positive for colon carcinoma in his father at age 27 and died 8 years later for multiple myeloma. Paternal grandmother possibly had colon carcinoma but he is not absolutely sure.  History reviewed. No pertinent past medical history.  Past Surgical History  Procedure Date  . Back surgery     History reviewed. No pertinent family history. Social History:  reports that he has never smoked. He does not have any smokeless tobacco history on file. He reports that he does not drink alcohol or use illicit drugs.  Allergies: No Known Allergies  Medications Prior to Admission  Medication Dose Route Frequency Provider Last Rate Last Dose  . 0.45 % sodium chloride infusion   Intravenous Once Malissa Hippo, MD 20 mL/hr at 06/23/11 0804 1,000 mL at 06/23/11 0804  . meperidine (DEMEROL) 50 MG/ML injection           . midazolam (VERSED) 5 MG/5ML injection           . simethicone susp in sterile water 1000 mL irrigation    PRN Malissa Hippo, MD       Medications Prior to Admission  Medication Sig Dispense Refill  . aspirin 325 MG tablet Take 325 mg by mouth daily.      Marland Kitchen omega-3 acid ethyl esters (LOVAZA) 1 G capsule Take 1 g by mouth daily.        No results found for this or any previous visit (from the past 48 hour(s)). No results found.  Review of Systems  Constitutional: Negative for weight loss.  Gastrointestinal: Negative for abdominal pain, diarrhea, constipation, blood in stool and melena.    Blood pressure 131/88, pulse 71, temperature 97.9 F (36.6 C), temperature source Oral, resp. rate 18, height 6\' 3"  (1.905 m), weight 225 lb (102.059 kg), SpO2  97.00%. Physical Exam  Constitutional: He appears well-developed and well-nourished.  HENT:  Mouth/Throat: Oropharynx is clear and moist.  Eyes: Conjunctivae are normal. No scleral icterus.  Neck: No thyromegaly present.  Cardiovascular: Normal rate, regular rhythm and normal heart sounds.   No murmur heard. Respiratory: Effort normal and breath sounds normal.  GI: Soft. He exhibits no distension and no mass. There is no tenderness.  Musculoskeletal: He exhibits no edema.  Lymphadenopathy:    He has no cervical adenopathy.  Neurological: He is alert.  Skin: Skin is warm.     Assessment/Plan High-risk screening colonoscopy.  Ruthene Methvin U 06/23/2011, 8:21 AM

## 2011-06-23 NOTE — Op Note (Signed)
COLONOSCOPY PROCEDURE REPORT  PATIENT:  [name]  MR#:  [id number] Birthdate:  1956/05/16, 56 y.o., male Endoscopist:  Dr. Malissa Hippo, MD Referred By:  Dr. Oneal Deputy. [name], MD Procedure Date: 06/23/2011  Procedure:   Colonoscopy  Indications:  Patient is 56 year old Caucasian male who is undergoing high-risk screening colonoscopy. His last exam was in September 2007. Father had surgery for rectal adenocarcinoma in the 12s and died of multiple myeloma at age 29.  Informed Consent:   Procedure and risks were reviewed with the patient and informed consent was obtained.  Medications:  Demerol 50 mg IV Versed 6 mg IV  Description of procedure:  After a digital rectal exam was performed, that colonoscope was advanced from the anus through the rectum and colon to the area of the cecum, ileocecal valve and appendiceal orifice. The cecum was deeply intubated. These structures were well-seen and photographed for the record. From the level of the cecum and ileocecal valve, the scope was slowly and cautiously withdrawn. The mucosal surfaces were carefully surveyed utilizing scope tip to flexion to facilitate fold flattening as needed. The scope was pulled down into the rectum where a thorough exam including retroflexion was performed. Terminal ileum was also examined.  Findings:  Excellent prep. Normal terminal ileum 5 mm polyp was cold snared from the hepatic flexure. Three 3-4 mm polyps ablated via cold biopsy from mid transverse colon and submitted in one container. Normal rectal mucosa and anal rectal junction.   Therapeutic/Diagnostic Maneuvers Performed:  See above  Complications:  None  Cecal Withdrawal Time:  12 minutes  Impression:  Normal terminal ileum. 5 mm polyp cold snared from hepatic flexure. Three small polyps ablated by cold biopsy from the transverse colon and submitted in one container.   Recommendations:  Standard instructions given. I will be contacting  patient with results of biopsy. He should return for next exam in 5 years.  [name],[name]  06/23/2011 9:06 AM  CC: Dr. Fredirick Maudlin, MD, MD & Dr. Bonnetta Barry ref. provider found

## 2011-06-29 ENCOUNTER — Encounter (HOSPITAL_COMMUNITY): Payer: Self-pay | Admitting: Internal Medicine

## 2011-07-05 ENCOUNTER — Encounter (INDEPENDENT_AMBULATORY_CARE_PROVIDER_SITE_OTHER): Payer: Self-pay | Admitting: *Deleted

## 2013-05-06 ENCOUNTER — Emergency Department (HOSPITAL_COMMUNITY)
Admission: EM | Admit: 2013-05-06 | Discharge: 2013-05-06 | Disposition: A | Discharge status: Home / Self Care | Payer: BC Managed Care – PPO | Attending: Emergency Medicine | Admitting: Emergency Medicine

## 2013-05-06 ENCOUNTER — Encounter (HOSPITAL_COMMUNITY): Payer: Self-pay | Admitting: Emergency Medicine

## 2013-05-06 DIAGNOSIS — R197 Diarrhea, unspecified: Secondary | ICD-10-CM | POA: Insufficient documentation

## 2013-05-06 DIAGNOSIS — Z79899 Other long term (current) drug therapy: Secondary | ICD-10-CM | POA: Insufficient documentation

## 2013-05-06 DIAGNOSIS — IMO0001 Reserved for inherently not codable concepts without codable children: Secondary | ICD-10-CM | POA: Insufficient documentation

## 2013-05-06 DIAGNOSIS — J029 Acute pharyngitis, unspecified: Secondary | ICD-10-CM | POA: Insufficient documentation

## 2013-05-06 DIAGNOSIS — Z7982 Long term (current) use of aspirin: Secondary | ICD-10-CM | POA: Insufficient documentation

## 2013-05-06 DIAGNOSIS — R110 Nausea: Secondary | ICD-10-CM | POA: Insufficient documentation

## 2013-05-06 DIAGNOSIS — R52 Pain, unspecified: Secondary | ICD-10-CM | POA: Insufficient documentation

## 2013-05-06 DIAGNOSIS — J111 Influenza due to unidentified influenza virus with other respiratory manifestations: Secondary | ICD-10-CM | POA: Insufficient documentation

## 2013-05-06 LAB — RAPID STREP SCREEN (MED CTR MEBANE ONLY): Streptococcus, Group A Screen (Direct): NEGATIVE

## 2013-05-06 MED ORDER — SODIUM CHLORIDE 0.9 % IV BOLUS (SEPSIS)
1000.0000 mL | Freq: Once | INTRAVENOUS | Status: AC
  Administered 2013-05-06: 1000 mL via INTRAVENOUS

## 2013-05-06 MED ORDER — ONDANSETRON HCL 4 MG PO TABS: 4.0000 mg | ORAL_TABLET | Freq: Four times a day (QID) | ORAL | Status: DC

## 2013-05-06 MED ORDER — KETOROLAC TROMETHAMINE 30 MG/ML IJ SOLN
30.0000 mg | Freq: Once | INTRAMUSCULAR | Status: AC
  Administered 2013-05-06: 30 mg via INTRAVENOUS
  Filled 2013-05-06: qty 1

## 2013-05-06 NOTE — ED Notes (Addendum)
Patient c/o fevers, nausea with dry heaves, and body aches since Monday. Per patient highest fever noted 102. Patient also reports a dry cough. Patient reports taking cough syrup and aspirin last night at 10 last night.  Patient reports some improvement,emny in coughing.

## 2013-05-06 NOTE — ED Provider Notes (Signed)
CSN: [id number]     Arrival date & time 05/06/13  0730 History  This chart was scribed for Shanna Cisco, MD by Quintella Reichert, ED scribe.  This patient was seen in room [facility] and the patient's care was started at 8:04 AM.  Chief Complaint  Patient presents with  . Fever  . Generalized Body Aches    Patient is a 57 y.o. male presenting with fever. The history is provided by the patient. No language interpreter was used.  Fever Max temp prior to arrival:  102 Temp source:  Oral Severity:  Moderate Onset quality:  Gradual Timing:  Intermittent Progression:  Worsening Chronicity:  New Associated symptoms: chest pain (only when coughing), chills, cough, diarrhea, headaches, myalgias, nausea and sore throat   Associated symptoms: no confusion, no dysuria, no rash and no vomiting     HPI Comments: ABIGAIL MARSIGLIA is a 57 y.o. male who presents to the Emergency Department complaining of one week of persistent worsening flu-like symptoms.  Pt states 6 days ago he developed a dry cough.  Since then he has also developed nausea, fevers, chills, HA, diarrhea, generalized body aches, and decreased appetite.  He states he had a fever up to 102 F at its worst.  Pt states he has had dry heaves but no vomiting.  His coughing has improved somewhat today.  He has had several episodes of diarrhea daily for the past 5 days described as watery stools without blood.  He also notes a mild sore throat that began this morning.  He states his chest hurts slightly only when he coughs.  He denies difficulty breathing or abdominal pain.  He has attempted to treat symptoms with OTC cold & flu medications, without relief.  Pt denies recent sick contacts to his knowledge.  He received his flu shot 2 months ago.  He denies chronic medical conditions or regular medication usage.   History reviewed. No pertinent past medical history.   Past Surgical History  Procedure Laterality Date  . Back surgery    .  Colonoscopy  06/23/2011    Procedure: COLONOSCOPY;  Surgeon: Malissa Hippo, MD;  Location: [facility];  Service: Endoscopy;  Laterality: N/A;  3:00    Family History  Problem Relation Age of Onset  . Cancer Father      History  Substance Use Topics  . Smoking status: Never Smoker   . Smokeless tobacco: Never Used  . Alcohol Use: No     Review of Systems  Constitutional: Positive for fever, chills and appetite change.  HENT: Positive for sore throat. Negative for facial swelling and trouble swallowing.   Eyes: Negative for photophobia and pain.  Respiratory: Positive for cough. Negative for chest tightness and shortness of breath.   Cardiovascular: Positive for chest pain (only when coughing). Negative for leg swelling.  Gastrointestinal: Positive for nausea and diarrhea. Negative for vomiting, abdominal pain and constipation.  Endocrine: Negative for polydipsia and polyuria.  Genitourinary: Negative for dysuria, urgency, decreased urine volume and difficulty urinating.  Musculoskeletal: Positive for myalgias. Negative for back pain and gait problem.  Skin: Negative for color change, rash and wound.  Allergic/Immunologic: Negative for immunocompromised state.  Neurological: Positive for headaches. Negative for dizziness, facial asymmetry, speech difficulty, weakness and numbness.  Psychiatric/Behavioral: Negative for confusion, decreased concentration and agitation.     Allergies  Review of patient's allergies indicates no known allergies.  Home Medications   Current Outpatient Rx  Name  Route  Sig  Dispense  Refill  . aspirin 325 MG tablet   Oral   Take 325 mg by mouth daily.         Marland Kitchen omega-3 acid ethyl esters (LOVAZA) 1 G capsule   Oral   Take 1 g by mouth daily.         . ondansetron (ZOFRAN) 4 MG tablet   Oral   Take 1 tablet (4 mg total) by mouth every 6 (six) hours.   12 tablet   0    BP 130/74  Pulse 87  Temp(Src) 98.7 F (37.1 C) (Oral)   Resp 20  Ht 6\' 3"  (1.905 m)  Wt 225 lb (102.059 kg)  BMI 28.12 kg/m2  SpO2 94%  Physical Exam  Nursing note and vitals reviewed. Constitutional: He is oriented to person, place, and time. He appears well-developed and well-nourished. No distress.  HENT:  Head: Normocephalic and atraumatic.  Right Ear: Tympanic membrane, external ear and ear canal normal.  Left Ear: Tympanic membrane, external ear and ear canal normal.  Mouth/Throat: Oropharyngeal exudate and posterior oropharyngeal erythema present. No posterior oropharyngeal edema or tonsillar abscesses.  Eyes: Pupils are equal, round, and reactive to light.  Neck: Normal range of motion. Neck supple.  Cardiovascular: Normal rate, regular rhythm and normal heart sounds.  Exam reveals no gallop and no friction rub.   No murmur heard. Pulmonary/Chest: Effort normal and breath sounds normal. No respiratory distress. He has no wheezes. He has no rales.  Abdominal: Soft. Bowel sounds are normal. He exhibits no distension and no mass. There is no tenderness. There is no rebound and no guarding.  Musculoskeletal: Normal range of motion. He exhibits no edema and no tenderness.  Neurological: He is alert and oriented to person, place, and time.  Skin: Skin is warm and dry.  Psychiatric: He has a normal mood and affect.    ED Course  Procedures (including critical care time)  DIAGNOSTIC STUDIES: Oxygen Saturation is 94% on room air, adequate by my interpretation.    COORDINATION OF CARE: 8:13 AM-Discussed treatment plan which includes strep screen, IV fluids and Toradol with pt at bedside and pt agreed to plan.    Labs Review Labs Reviewed  RAPID STREP SCREEN  CULTURE, GROUP A STREP  INFLUENZA PANEL BY PCR    Imaging Review No results found.  EKG Interpretation   None       MDM   1. Influenza-like illness    Pt is a 57 y.o. male with Pmhx as above who presents with about 6 days of intermittent fevers, dry cough,  myalgias, nausea, dry heaves, sore throat, anorexia, with addition of d/a two days ago.  +sick contacts at work.  No SOB, has chest tightness only w/ cough.  NO ab pain, no ear pain.  On PE, pt afebrile, VSS, in NAD.  Cardiopulm exam benign.  He has BL tonsillar exudates & erythema w/o edema, signs of PTA or RPA.  Abdominal exam benign.  Strongly suspect influenza given constellation of symptoms.  Doubt pna given clear lungs, no SOB, non-productive cough, and afebrile.  Rapid strep ordered given tonsilar exudates & was negative.  Pt given 1L NS, toradol w/ moderate improvement of symptoms. Pt can continue with suuportive care at home w/ PO hydration, antipyretics, OTC cough suppressant.  Return precautions given for new or worsening symptoms including SOB, inability to tolerate PO, controlled vomiting or d/a.  Rx for zofran given.          I  personally performed the services described in this documentation, which was scribed in my presence. The recorded information has been reviewed and is accurate.     Shanna Cisco, MD 05/06/13 [phone]

## 2013-05-06 NOTE — ED Notes (Signed)
Pt c/o dry cough, fever, chills, generalized body aches x6 days as well as intermittent N/V/D x4 days. Pt has been taking ibuprofen for fever and cold syrup and reports minimal relief.

## 2013-05-08 LAB — CULTURE, GROUP A STREP

## 2014-12-05 ENCOUNTER — Ambulatory Visit (INDEPENDENT_AMBULATORY_CARE_PROVIDER_SITE_OTHER): Payer: BLUE CROSS/BLUE SHIELD | Admitting: Otolaryngology

## 2014-12-05 DIAGNOSIS — H903 Sensorineural hearing loss, bilateral: Secondary | ICD-10-CM | POA: Diagnosis not present

## 2016-01-01 ENCOUNTER — Ambulatory Visit (INDEPENDENT_AMBULATORY_CARE_PROVIDER_SITE_OTHER): Payer: BLUE CROSS/BLUE SHIELD | Admitting: Otolaryngology

## 2016-01-01 DIAGNOSIS — H903 Sensorineural hearing loss, bilateral: Secondary | ICD-10-CM

## 2016-01-01 DIAGNOSIS — H6123 Impacted cerumen, bilateral: Secondary | ICD-10-CM

## 2016-06-08 ENCOUNTER — Telehealth (INDEPENDENT_AMBULATORY_CARE_PROVIDER_SITE_OTHER): Payer: Self-pay | Admitting: *Deleted

## 2016-06-08 ENCOUNTER — Encounter (INDEPENDENT_AMBULATORY_CARE_PROVIDER_SITE_OTHER): Payer: Self-pay | Admitting: *Deleted

## 2016-06-08 NOTE — Telephone Encounter (Signed)
Patient needs trilyte 

## 2016-06-08 NOTE — Telephone Encounter (Signed)
Referring MD/PCP: hawkins   Procedure: tcs  Reason/Indication:  Hx polyps, fam hx colon ca  Has patient had this procedure before?  Yes, 2013 -- epic  If so, when, by whom and where?    Is there a family history of colon cancer?  Yes, father  Who?  What age when diagnosed?    Is patient diabetic?   no      Does patient have prosthetic heart valve or mechanical valve?  no  Do you have a pacemaker?  no  Has patient ever had endocarditis? no  Has patient had joint replacement within last 12 months?  no  Does patient tend to be constipated or take laxatives? no  Does patient have a history of alcohol/drug use?  no  Is patient on Coumadin, Plavix and/or Aspirin? no  Medications: none  Allergies: nkde  Medication Adjustment:   Procedure date & time: 07/02/17 at 830

## 2016-06-11 ENCOUNTER — Other Ambulatory Visit (INDEPENDENT_AMBULATORY_CARE_PROVIDER_SITE_OTHER): Payer: Self-pay | Admitting: *Deleted

## 2016-06-11 DIAGNOSIS — Z86010 Personal history of colonic polyps: Secondary | ICD-10-CM

## 2016-06-11 DIAGNOSIS — Z800 Family history of malignant neoplasm of digestive organs: Secondary | ICD-10-CM | POA: Insufficient documentation

## 2016-06-14 MED ORDER — PEG 3350-KCL-NA BICARB-NACL 420 G PO SOLR: 4000.0000 mL | Freq: Once | ORAL | 0 refills | Status: AC

## 2016-06-14 NOTE — Telephone Encounter (Signed)
agree

## 2016-07-06 ENCOUNTER — Encounter (INDEPENDENT_AMBULATORY_CARE_PROVIDER_SITE_OTHER): Payer: Self-pay | Admitting: *Deleted

## 2016-07-15 ENCOUNTER — Encounter (HOSPITAL_COMMUNITY): Payer: Self-pay | Admitting: *Deleted

## 2016-07-15 ENCOUNTER — Encounter (HOSPITAL_COMMUNITY)
Admission: RE | Disposition: A | Discharge status: Home / Self Care | Payer: Self-pay | Source: Ambulatory Visit | Attending: Internal Medicine

## 2016-07-15 ENCOUNTER — Ambulatory Visit (HOSPITAL_COMMUNITY)
Admission: RE | Admit: 2016-07-15 | Discharge: 2016-07-15 | Disposition: A | Discharge status: Home / Self Care | Payer: BLUE CROSS/BLUE SHIELD | Source: Ambulatory Visit | Attending: Internal Medicine | Admitting: Internal Medicine

## 2016-07-15 DIAGNOSIS — D123 Benign neoplasm of transverse colon: Secondary | ICD-10-CM

## 2016-07-15 DIAGNOSIS — Z800 Family history of malignant neoplasm of digestive organs: Secondary | ICD-10-CM | POA: Insufficient documentation

## 2016-07-15 DIAGNOSIS — K648 Other hemorrhoids: Secondary | ICD-10-CM

## 2016-07-15 DIAGNOSIS — Z1211 Encounter for screening for malignant neoplasm of colon: Secondary | ICD-10-CM | POA: Insufficient documentation

## 2016-07-15 DIAGNOSIS — D125 Benign neoplasm of sigmoid colon: Secondary | ICD-10-CM | POA: Diagnosis not present

## 2016-07-15 DIAGNOSIS — Z09 Encounter for follow-up examination after completed treatment for conditions other than malignant neoplasm: Secondary | ICD-10-CM | POA: Diagnosis not present

## 2016-07-15 DIAGNOSIS — Z86010 Personal history of colonic polyps: Secondary | ICD-10-CM | POA: Insufficient documentation

## 2016-07-15 HISTORY — PX: COLONOSCOPY: SHX5424

## 2016-07-15 SURGERY — COLONOSCOPY
Anesthesia: Moderate Sedation

## 2016-07-15 MED ORDER — MIDAZOLAM HCL 5 MG/5ML IJ SOLN
INTRAMUSCULAR | Status: AC
  Filled 2016-07-15: qty 10

## 2016-07-15 MED ORDER — SODIUM CHLORIDE 0.9 % IV SOLN
INTRAVENOUS | Status: DC
  Administered 2016-07-15: 09:00:00 via INTRAVENOUS

## 2016-07-15 MED ORDER — MEPERIDINE HCL 50 MG/ML IJ SOLN
INTRAMUSCULAR | Status: AC
  Filled 2016-07-15: qty 1

## 2016-07-15 MED ORDER — MIDAZOLAM HCL 5 MG/5ML IJ SOLN
INTRAMUSCULAR | Status: DC | PRN
  Administered 2016-07-15 (×2): 2 mg via INTRAVENOUS
  Administered 2016-07-15: 1 mg via INTRAVENOUS
  Administered 2016-07-15: 2 mg via INTRAVENOUS

## 2016-07-15 MED ORDER — MEPERIDINE HCL 50 MG/ML IJ SOLN
INTRAMUSCULAR | Status: DC | PRN
  Administered 2016-07-15 (×2): 25 mg via INTRAVENOUS

## 2016-07-15 NOTE — Discharge Instructions (Signed)
Resume usual medications and diet. No driving for 24 hours. Physician will call with biopsy results. Next colonoscopy in 5 years.    Colonoscopy, Adult, Care After This sheet gives you information about how to care for yourself after your procedure. Your doctor may also give you more specific instructions. If you have problems or questions, call your doctor. Follow these instructions at home: General instructions  For the first 24 hours after the procedure:  Do not drive or use machinery.  Do not sign important documents.  Do not drink alcohol.  Do your daily activities more slowly than normal.  Eat foods that are soft and easy to digest.  Rest often.  Take over-the-counter or prescription medicines only as told by your doctor.  It is up to you to get the results of your procedure. Ask your doctor, or the department performing the procedure, when your results will be ready. To help cramping and bloating:  Try walking around.  Put heat on your belly (abdomen) as told by your doctor. Use a heat source that your doctor recommends, such as a moist heat pack or a heating pad.  Put a towel between your skin and the heat source.  Leave the heat on for 20-30 minutes.  Remove the heat if your skin turns bright red. This is especially important if you cannot feel pain, heat, or cold. You can get burned. Eating and drinking  Drink enough fluid to keep your pee (urine) clear or pale yellow.  Return to your normal diet as told by your doctor. Avoid heavy or fried foods that are hard to digest.  Avoid drinking alcohol for as long as told by your doctor. Contact a doctor if:  You have blood in your poop (stool) 2-3 days after the procedure. Get help right away if:  You have more than a small amount of blood in your poop.  You see large clumps of tissue (blood clots) in your poop.  Your belly is swollen.  You feel sick to your stomach (nauseous).  You throw up  (vomit).  You have a fever.  You have belly pain that gets worse, and medicine does not help your pain. This information is not intended to replace advice given to you by your health care provider. Make sure you discuss any questions you have with your health care provider. Document Released: 06/12/2010 Document Revised: 02/02/2016 Document Reviewed: 02/02/2016 Elsevier Interactive Patient Education  2017 [employer].    Colon Polyps Introduction Polyps are tissue growths inside the body. Polyps can grow in many places, including the large intestine (colon). A polyp may be a round bump or a mushroom-shaped growth. You could have one polyp or several. Most colon polyps are noncancerous (benign). However, some colon polyps can become cancerous over time. What are the causes? The exact cause of colon polyps is not known. What increases the risk? This condition is more likely to develop in people who:  Have a family history of colon cancer or colon polyps.  Are older than 60 or older than 45 if they are African American.  Have inflammatory bowel disease, such as ulcerative colitis or Crohn disease.  Are overweight.  Smoke cigarettes.  Do not get enough exercise.  Drink too much alcohol.  Eat a diet that is:  High in fat and red meat.  Low in fiber.  Had childhood cancer that was treated with abdominal radiation. What are the signs or symptoms? Most polyps do not cause symptoms. If you  have symptoms, they may include:  Blood coming from your rectum when having a bowel movement.  Blood in your stool.The stool may look dark red or black.  A change in bowel habits, such as constipation or diarrhea. How is this diagnosed? This condition is diagnosed with a colonoscopy. This is a procedure that uses a lighted, flexible scope to look at the inside of your colon. How is this treated? Treatment for this condition involves removing any polyps that are found. Those polyps will  then be tested for cancer. If cancer is found, your health care provider will talk to you about options for colon cancer treatment. Follow these instructions at home: Diet  Eat plenty of fiber, such as fruits, vegetables, and whole grains.  Eat foods that are high in calcium and vitamin D, such as milk, cheese, yogurt, eggs, liver, fish, and broccoli.  Limit foods high in fat, red meats, and processed meats, such as hot dogs, sausage, bacon, and lunch meats.  Maintain a healthy weight, or lose weight if recommended by your health care provider. General instructions  Do not smoke cigarettes.  Do not drink alcohol excessively.  Keep all follow-up visits as told by your health care provider. This is important. This includes keeping regularly scheduled colonoscopies. Talk to your health care provider about when you need a colonoscopy.  Exercise every day or as told by your health care provider. Contact a health care provider if:  You have new or worsening bleeding during a bowel movement.  You have new or increased blood in your stool.  You have a change in bowel habits.  You unexpectedly lose weight. This information is not intended to replace advice given to you by your health care provider. Make sure you discuss any questions you have with your health care provider. Document Released: 02/04/2004 Document Revised: 10/16/2015 Document Reviewed: 03/31/2015  2017 Elsevier   Hemorrhoids Hemorrhoids are swollen veins in and around the rectum or anus. There are two types of hemorrhoids:  Internal hemorrhoids. These occur in the veins that are just inside the rectum. They may poke through to the outside and become irritated and painful.  External hemorrhoids. These occur in the veins that are outside of the anus and can be felt as a painful swelling or hard lump near the anus. Most hemorrhoids do not cause serious problems, and they can be managed with home treatments such as diet and  lifestyle changes. If home treatments do not help your symptoms, procedures can be done to shrink or remove the hemorrhoids. What are the causes? This condition is caused by increased pressure in the anal area. This pressure may result from various things, including:  Constipation.  Straining to have a bowel movement.  Diarrhea.  Pregnancy.  Obesity.  Sitting for long periods of time.  Heavy lifting or other activity that causes you to strain.  Anal sex. What are the signs or symptoms? Symptoms of this condition include:  Pain.  Anal itching or irritation.  Rectal bleeding.  Leakage of stool (feces).  Anal swelling.  One or more lumps around the anus. How is this diagnosed? This condition can often be diagnosed through a visual exam. Other exams or tests may also be done, such as:  Examination of the rectal area with a gloved hand (digital rectal exam).  Examination of the anal canal using a small tube (anoscope).  A blood test, if you have lost a significant amount of blood.  A test to look inside  the colon (sigmoidoscopy or colonoscopy). How is this treated? This condition can usually be treated at home. However, various procedures may be done if dietary changes, lifestyle changes, and other home treatments do not help your symptoms. These procedures can help make the hemorrhoids smaller or remove them completely. Some of these procedures involve surgery, and others do not. Common procedures include:  Rubber band ligation. Rubber bands are placed at the base of the hemorrhoids to cut off the blood supply to them.  Sclerotherapy. Medicine is injected into the hemorrhoids to shrink them.  Infrared coagulation. A type of light energy is used to get rid of the hemorrhoids.  Hemorrhoidectomy surgery. The hemorrhoids are surgically removed, and the veins that supply them are tied off.  Stapled hemorrhoidopexy surgery. A circular stapling device is used to remove the  hemorrhoids and use staples to cut off the blood supply to them. Follow these instructions at home: Eating and drinking  Eat foods that have a lot of fiber in them, such as whole grains, beans, nuts, fruits, and vegetables. Ask your health care provider about taking products that have added fiber (fiber supplements).  Drink enough fluid to keep your urine clear or pale yellow. Managing pain and swelling  Take warm sitz baths for 20 minutes, 3-4 times a day to ease pain and discomfort.  If directed, apply ice to the affected area. Using ice packs between sitz baths may be helpful.  Put ice in a plastic bag.  Place a towel between your skin and the bag.  Leave the ice on for 20 minutes, 2-3 times a day. General instructions  Take over-the-counter and prescription medicines only as told by your health care provider.  Use medicated creams or suppositories as told.  Exercise regularly.  Go to the bathroom when you have the urge to have a bowel movement. Do not wait.  Avoid straining to have bowel movements.  Keep the anal area dry and clean. Use wet toilet paper or moist towelettes after a bowel movement.  Do not sit on the toilet for long periods of time. This increases blood pooling and pain. Contact a health care provider if:  You have increasing pain and swelling that are not controlled by treatment or medicine.  You have uncontrolled bleeding.  You have difficulty having a bowel movement, or you are unable to have a bowel movement.  You have pain or inflammation outside the area of the hemorrhoids. This information is not intended to replace advice given to you by your health care provider. Make sure you discuss any questions you have with your health care provider. Document Released: 05/07/2000 Document Revised: 10/08/2015 Document Reviewed: 01/22/2015 Elsevier Interactive Patient Education  2017 [employer].

## 2016-07-15 NOTE — H&P (Signed)
[name] is an 61 y.o. male.   Chief Complaint: Patient is here for colonoscopy. HPI: She is 33-year-old Caucasian male with history of colonic adenomas and family history of CRC is here for surveillance colonoscopy. Last exam was in January 2013 with removal of 4 small polyps and these are adenomas. He denies abdominal pain change in bowel habits or rectal bleeding. Family history significant for CRC and father who was 84 at the time of diagnosis and died of multiple myeloma at age 44.  History reviewed. No pertinent past medical history.  Past Surgical History:  Procedure Laterality Date  . BACK SURGERY    . COLONOSCOPY  06/23/2011   Procedure: COLONOSCOPY;  Surgeon: John Houston, MD;  Location: [facility];  Service: Endoscopy;  Laterality: N/A;  3:00    Family History  Problem Relation Age of Onset  . Cancer Father    Social History:  reports that he has never smoked. He has never used smokeless tobacco. He reports that he does not drink alcohol or use drugs.  Allergies: No Known Allergies  Medications Prior to Admission  Medication Sig Dispense Refill  . Multiple Vitamin (MULTIVITAMIN) tablet Take 1 tablet by mouth daily.      No results found for this or any previous visit (from the past 48 hour(s)). No results found.  ROS  Blood pressure 125/81, pulse 75, temperature 97.6 F (36.4 C), temperature source Oral, resp. rate 14, height '6\' 3"'$  (1.905 m), weight 224 lb (101.6 kg), SpO2 98 %. Physical Exam  Constitutional: He appears well-developed and well-nourished.  HENT:  Mouth/Throat: Oropharynx is clear and moist.  Eyes: Conjunctivae are normal. No scleral icterus.  Neck: No thyromegaly present.  Cardiovascular: Normal rate, regular rhythm and normal heart sounds.   No murmur heard. Respiratory: Effort normal and breath sounds normal.  GI: Soft. He exhibits no distension and no mass. There is no tenderness.  Musculoskeletal: He exhibits no edema.   Lymphadenopathy:    He has no cervical adenopathy.  Neurological: He is alert.  Skin: Skin is warm and dry.     Assessment/Plan History of colonic adenomas. Family history of CRC. Surveillance colonoscopy.  John Laser, MD 07/15/2016, 9:20 AM

## 2016-07-15 NOTE — Op Note (Signed)
[facility] Patient Name: [name] Procedure Date: 07/15/2016 9:01 AM MRN: [id number] Date of Birth: Dec 23, 1955 Attending MD: Hildred Laser , MD CSN: [id number] Age: 61 Admit Type: Outpatient Procedure:                Colonoscopy Indications:              High risk colon cancer surveillance: Personal                            history of colonic polyps, Family history of colon                            cancer in a first-degree relative Providers:                Hildred Laser, MD, Charlyne Petrin RN, RN, Aram Candela Referring MD:             Jasper Loser. Luan Pulling, MD Medicines:                Meperidine 50 mg IV, Midazolam 7 mg IV Complications:            No immediate complications. Estimated Blood Loss:     Estimated blood loss was minimal. Procedure:                Pre-Anesthesia Assessment:                           - Prior to the procedure, a History and Physical                            was performed, and patient medications and                            allergies were reviewed. The patient's tolerance of                            previous anesthesia was also reviewed. The risks                            and benefits of the procedure and the sedation                            options and risks were discussed with the patient.                            All questions were answered, and informed consent                            was obtained. Prior Anticoagulants: The patient has                            taken no previous anticoagulant or antiplatelet  agents. ASA Grade Assessment: I - A normal, healthy                            patient. After reviewing the risks and benefits,                            the patient was deemed in satisfactory condition to                            undergo the procedure.                           After obtaining informed consent, the colonoscope                            was  passed under direct vision. Throughout the                            procedure, the patient's blood pressure, pulse, and                            oxygen saturations were monitored continuously. The                            EC-[id number]) scope was introduced through                            the anus and advanced to the the cecum, identified                            by appendiceal orifice and ileocecal valve. The                            colonoscopy was performed without difficulty. The                            patient tolerated the procedure well. The quality                            of the bowel preparation was adequate. The                            ileocecal valve, appendiceal orifice, and rectum                            were photographed. Scope In: 9:31:53 AM Scope Out: 10:03:29 AM Scope Withdrawal Time: 0 hours 19 minutes 17 seconds  Total Procedure Duration: 0 hours 31 minutes 36 seconds  Findings:      The perianal and digital rectal examinations were normal.      Two sessile polyps were found in the sigmoid colon and transverse colon.       The polyps were small in size. These were biopsied with a cold forceps       for histology. The pathology specimen was placed into  Bottle Number 1.      The exam was otherwise normal throughout the examined colon.      Internal hemorrhoids were found during retroflexion. The hemorrhoids       were small. Impression:               - Two small polyps in the sigmoid colon and in the                            transverse colon. Biopsied.                           - Internal hemorrhoids. Moderate Sedation:      Moderate (conscious) sedation was administered by the endoscopy nurse       and supervised by the endoscopist. The following parameters were       monitored: oxygen saturation, heart rate, blood pressure, CO2       capnography and response to care. Total physician intraservice time was       38  minutes. Recommendation:           - Patient has a contact number available for                            emergencies. The signs and symptoms of potential                            delayed complications were discussed with the                            patient. Return to normal activities tomorrow.                            Written discharge instructions were provided to the                            patient.                           - Resume previous diet today.                           - Continue present medications.                           - Await pathology results.                           - Repeat colonoscopy in 5 years for surveillance. Procedure Code(s):        --- Professional ---                           [phone], Colonoscopy, flexible; with biopsy, single                            or multiple                           99152, Moderate  sedation services provided by the                            same physician or other qualified health care                            professional performing the diagnostic or                            therapeutic service that the sedation supports,                            requiring the presence of an independent trained                            observer to assist in the monitoring of the                            patient's level of consciousness and physiological                            status; initial 15 minutes of intraservice time,                            patient age 24 years or older                           [phone], Moderate sedation services; each additional                            15 minutes intraservice time                           99153, Moderate sedation services; each additional                            15 minutes intraservice time Diagnosis Code(s):        --- Professional ---                           Z86.010, Personal history of colonic polyps                           D12.5, Benign neoplasm of sigmoid  colon                           D12.3, Benign neoplasm of transverse colon (hepatic                            flexure or splenic flexure)                           K64.8, Other hemorrhoids                           Z80.0, Family history of malignant  neoplasm of                            digestive organs CPT copyright 2016 [employer]. All rights reserved. The codes documented in this report are preliminary and upon coder review may  be revised to meet current compliance requirements. Hildred Laser, MD Hildred Laser, MD 07/15/2016 10:12:35 AM This report has been signed electronically. Number of Addenda: 0

## 2016-07-19 ENCOUNTER — Encounter (HOSPITAL_COMMUNITY): Payer: Self-pay | Admitting: Internal Medicine

## 2016-09-10 ENCOUNTER — Other Ambulatory Visit: Payer: Self-pay | Admitting: General Surgery

## 2016-12-30 ENCOUNTER — Ambulatory Visit (INDEPENDENT_AMBULATORY_CARE_PROVIDER_SITE_OTHER): Payer: BLUE CROSS/BLUE SHIELD | Admitting: Otolaryngology

## 2016-12-30 DIAGNOSIS — H6123 Impacted cerumen, bilateral: Secondary | ICD-10-CM | POA: Diagnosis not present

## 2016-12-30 DIAGNOSIS — H903 Sensorineural hearing loss, bilateral: Secondary | ICD-10-CM

## 2017-02-12 ENCOUNTER — Emergency Department (HOSPITAL_COMMUNITY)
Admission: EM | Admit: 2017-02-12 | Discharge: 2017-02-12 | Disposition: A | Discharge status: Home / Self Care | Payer: BLUE CROSS/BLUE SHIELD | Attending: Emergency Medicine | Admitting: Emergency Medicine

## 2017-02-12 ENCOUNTER — Encounter (HOSPITAL_COMMUNITY): Payer: Self-pay | Admitting: Emergency Medicine

## 2017-02-12 DIAGNOSIS — Y999 Unspecified external cause status: Secondary | ICD-10-CM | POA: Diagnosis not present

## 2017-02-12 DIAGNOSIS — Y92007 Garden or yard of unspecified non-institutional (private) residence as the place of occurrence of the external cause: Secondary | ICD-10-CM | POA: Insufficient documentation

## 2017-02-12 DIAGNOSIS — X503XXA Overexertion from repetitive movements, initial encounter: Secondary | ICD-10-CM | POA: Insufficient documentation

## 2017-02-12 DIAGNOSIS — Y9389 Activity, other specified: Secondary | ICD-10-CM | POA: Diagnosis not present

## 2017-02-12 DIAGNOSIS — S3992XA Unspecified injury of lower back, initial encounter: Secondary | ICD-10-CM | POA: Diagnosis present

## 2017-02-12 DIAGNOSIS — S39012A Strain of muscle, fascia and tendon of lower back, initial encounter: Secondary | ICD-10-CM

## 2017-02-12 MED ORDER — NAPROXEN 500 MG PO TABS: 500.0000 mg | ORAL_TABLET | Freq: Two times a day (BID) | ORAL | 0 refills | Status: DC

## 2017-02-12 MED ORDER — METHYLPREDNISOLONE 4 MG PO TBPK
ORAL_TABLET | ORAL | 0 refills | Status: DC
Start: 2017-02-12 — End: 2020-03-12

## 2017-02-12 MED ORDER — CYCLOBENZAPRINE HCL 10 MG PO TABS
10.0000 mg | ORAL_TABLET | Freq: Once | ORAL | Status: AC
  Administered 2017-02-12: 10 mg via ORAL
  Filled 2017-02-12: qty 1

## 2017-02-12 MED ORDER — HYDROMORPHONE HCL 1 MG/ML IJ SOLN
1.0000 mg | Freq: Once | INTRAMUSCULAR | Status: AC
  Administered 2017-02-12: 1 mg via INTRAVENOUS
  Filled 2017-02-12: qty 1

## 2017-02-12 MED ORDER — OXYCODONE-ACETAMINOPHEN 5-325 MG PO TABS: 1.0000 | ORAL_TABLET | Freq: Four times a day (QID) | ORAL | 0 refills | Status: DC | PRN

## 2017-02-12 MED ORDER — MORPHINE SULFATE (PF) 4 MG/ML IV SOLN
4.0000 mg | Freq: Once | INTRAVENOUS | Status: AC
  Administered 2017-02-12: 4 mg via INTRAVENOUS
  Filled 2017-02-12: qty 1

## 2017-02-12 MED ORDER — FAMOTIDINE 20 MG PO TABS: 20.0000 mg | ORAL_TABLET | Freq: Two times a day (BID) | ORAL | 0 refills | Status: DC

## 2017-02-12 MED ORDER — KETOROLAC TROMETHAMINE 30 MG/ML IJ SOLN
15.0000 mg | Freq: Once | INTRAMUSCULAR | Status: AC
  Administered 2017-02-12: 15 mg via INTRAVENOUS
  Filled 2017-02-12: qty 1

## 2017-02-12 MED ORDER — METHYLPREDNISOLONE SODIUM SUCC 125 MG IJ SOLR
125.0000 mg | Freq: Once | INTRAMUSCULAR | Status: AC
  Administered 2017-02-12: 125 mg via INTRAVENOUS
  Filled 2017-02-12: qty 2

## 2017-02-12 MED ORDER — CYCLOBENZAPRINE HCL 10 MG PO TABS: 10.0000 mg | ORAL_TABLET | Freq: Two times a day (BID) | ORAL | 0 refills | Status: DC | PRN

## 2017-02-12 NOTE — ED Triage Notes (Signed)
Pt brought in EMS  . Pt reports was working in the yard and reports "felt something pull in lower back." pt reports back surgery in the past. Per EMS, pt became diaphoretic and pale when changing positions from sitting to getting on EMS stretcher. nad noted at time of arrival. IV started en route.

## 2017-02-12 NOTE — ED Notes (Signed)
ED Provider at bedside. 

## 2017-02-12 NOTE — ED Notes (Signed)
Pt discharged from ED with family.  AVS and prescriptions reviewed with verbalized understandings.

## 2017-02-12 NOTE — ED Notes (Signed)
Pt was able to sit up in bed and walk to restroom. Pt still reports muscle spasms.

## 2017-02-12 NOTE — ED Provider Notes (Signed)
[facility] DEPT Provider Note   CSN: [id number] Arrival date & time: 02/12/17  1729     History   Chief Complaint Chief Complaint  Patient presents with  . Back Pain    HPI [name] is a 61 y.o. male.  The history is provided by the patient. No language interpreter was used.   [name] is a 61 y.o. male who presents to the Emergency Department complaining of back pain. He has a history of back pain withsurgery performed 15 years ago. He has occasional flares of his back pain. Today he was working in the yard bending over and picking up sticks when he developed acute onset severe low back pain. Pain is in the mid back and a little bit more to the right side of his back. It is nonradiating. It is gone when he is at rest but when he goes to move or bend or twist he has severe recurrent pain. Pain is bad enough to cause diaphoresis nausea. This pain is similar to prior pain episodes but he has never had the diaphoresis and nausea associated with it. No fevers, chest pain, shortness breath, numbness, weakness. History reviewed. No pertinent past medical history.  Patient Active Problem List   Diagnosis Date Noted  . Family hx of colon cancer 06/11/2016  . Hx of adenomatous colonic polyps 06/11/2016    Past Surgical History:  Procedure Laterality Date  . BACK SURGERY    . COLONOSCOPY  06/23/2011   Procedure: COLONOSCOPY;  Surgeon: Rogene Houston, MD;  [facility];  Service: Endoscopy;  Laterality: N/A;  3:00  . COLONOSCOPY N/A 07/15/2016   Procedure: COLONOSCOPY;  Surgeon: Rogene Houston, MD;  Location: [facility];  Service: Endoscopy;  Laterality: N/A;  830       Home Medications    Prior to Admission medications   Medication Sig Start Date End Date Taking? Authorizing Provider  Multiple Vitamin (MULTIVITAMIN) tablet Take 1 tablet by mouth daily.   Yes [provider]  Propylene Glycol (SYSTANE BALANCE OP) Place 1-2 drops into both eyes  daily as needed.   Yes [provider]  cyclobenzaprine (FLEXERIL) 10 MG tablet Take 1 tablet (10 mg total) by mouth 2 (two) times daily as needed for muscle spasms. 02/12/17   Quintella Reichert, MD  famotidine (PEPCID) 20 MG tablet Take 1 tablet (20 mg total) by mouth 2 (two) times daily. 02/12/17   Quintella Reichert, MD  methylPREDNISolone (MEDROL DOSEPAK) 4 MG TBPK tablet Take according to label instructions 02/12/17   Quintella Reichert, MD  naproxen (NAPROSYN) 500 MG tablet Take 1 tablet (500 mg total) by mouth 2 (two) times daily. 02/12/17   Quintella Reichert, MD  oxyCODONE-acetaminophen (PERCOCET/ROXICET) 5-325 MG tablet Take 1 tablet by mouth every 6 (six) hours as needed for severe pain. 02/12/17   Quintella Reichert, MD    Family History Family History  Problem Relation Age of Onset  . Cancer Father     Social History Social History  Substance Use Topics  . Smoking status: Never Smoker  . Smokeless tobacco: Never Used  . Alcohol use No     Allergies   Patient has no known allergies.   Review of Systems Review of Systems  All other systems reviewed and are negative.    Physical Exam Updated Vital Signs BP 139/88 (BP Location: Right Arm)   Pulse 79   Temp 98.6 F (37 C) (Oral)   Resp 16   Ht  6\' 3"  (1.905 m)   Wt 104.3 kg (230 lb)   SpO2 97%   BMI 28.75 kg/m   Physical Exam  Constitutional: He is oriented to person, place, and time. He appears well-developed and well-nourished. No distress.  HENT:  Head: Normocephalic and atraumatic.  Cardiovascular: Normal rate and regular rhythm.   No murmur heard. Pulmonary/Chest: Effort normal and breath sounds normal. No respiratory distress.  Abdominal: Soft. There is no tenderness. There is no rebound and no guarding.  Musculoskeletal: He exhibits no edema or tenderness.  2+ DP pulses bilaterally. 5 out of 5 strength in bilateral lower extremities with sensation to light touch intact in bilateral lower extremities. Pain in  the low back is reproducible with movement of bilateral lower extremities.  Neurological: He is alert and oriented to person, place, and time.  Skin: Skin is warm and dry.  Psychiatric: He has a normal mood and affect. His behavior is normal.  Nursing note and vitals reviewed.    ED Treatments / Results  Labs (all labs ordered are listed, but only abnormal results are displayed) Labs Reviewed - No data to display  EKG  EKG Interpretation None       Radiology No results found.  Procedures Procedures (including critical care time)  Medications Ordered in ED Medications  ketorolac (TORADOL) 30 MG/ML injection 15 mg (15 mg Intravenous Given 02/12/17 1826)  cyclobenzaprine (FLEXERIL) tablet 10 mg (10 mg Oral Given 02/12/17 1825)  morphine 4 MG/ML injection 4 mg (4 mg Intravenous Given 02/12/17 1831)  HYDROmorphone (DILAUDID) injection 1 mg (1 mg Intravenous Given 02/12/17 1927)  HYDROmorphone (DILAUDID) injection 1 mg (1 mg Intravenous Given 02/12/17 2012)  methylPREDNISolone sodium succinate (SOLU-MEDROL) 125 mg/2 mL injection 125 mg (125 mg Intravenous Given 02/12/17 2012)     Initial Impression / Assessment and Plan / ED Course  I have reviewed the triage vital signs and the nursing notes.  Pertinent labs & imaging results that were available during my care of the patient were reviewed by me and considered in my medical decision making (see chart for details).     Patient with history of back problems here for evaluation of acute onset low back pain with bending. His pain is controlled when sitting still but he does have pain with movement. After treatment in the emergency department his pain is partially improved and he is able to ambulate. He is neurologically intact on examination. Presentation is not consistent with cauda equina, epidural abscess, ruptured AAA, dissection. Discussed with patient home care for acute low back pain. Discussed outpatient follow-up and return  precautions.  Final Clinical Impressions(s) / ED Diagnoses   Final diagnoses:  Strain of lumbar region, initial encounter    New Prescriptions Discharge Medication List as of 02/12/2017  8:56 PM    START taking these medications   Details  cyclobenzaprine (FLEXERIL) 10 MG tablet Take 1 tablet (10 mg total) by mouth 2 (two) times daily as needed for muscle spasms., Starting Sat 02/12/2017, Print    famotidine (PEPCID) 20 MG tablet Take 1 tablet (20 mg total) by mouth 2 (two) times daily., Starting Sat 02/12/2017, Print    methylPREDNISolone (MEDROL DOSEPAK) 4 MG TBPK tablet Take according to label instructions, Print    naproxen (NAPROSYN) 500 MG tablet Take 1 tablet (500 mg total) by mouth 2 (two) times daily., Starting Sat 02/12/2017, Print    oxyCODONE-acetaminophen (PERCOCET/ROXICET) 5-325 MG tablet Take 1 tablet by mouth every 6 (six) hours as needed for severe  pain., Starting Sat 02/12/2017, Print         Quintella Reichert, MD 02/12/17 2332

## 2017-04-07 ENCOUNTER — Ambulatory Visit (INDEPENDENT_AMBULATORY_CARE_PROVIDER_SITE_OTHER): Payer: BLUE CROSS/BLUE SHIELD | Admitting: Otolaryngology

## 2017-04-07 DIAGNOSIS — H6123 Impacted cerumen, bilateral: Secondary | ICD-10-CM

## 2017-10-19 ENCOUNTER — Ambulatory Visit (INDEPENDENT_AMBULATORY_CARE_PROVIDER_SITE_OTHER): Payer: PRIVATE HEALTH INSURANCE

## 2017-10-19 ENCOUNTER — Ambulatory Visit (INDEPENDENT_AMBULATORY_CARE_PROVIDER_SITE_OTHER): Payer: PRIVATE HEALTH INSURANCE | Admitting: Orthopaedic Surgery

## 2017-10-19 ENCOUNTER — Encounter: Payer: Self-pay | Admitting: Orthopaedic Surgery

## 2017-10-19 VITALS — BP 120/80 | HR 99 | Temp 97.5°F | Ht 74.5 in | Wt 235.0 lb

## 2017-10-19 DIAGNOSIS — M25571 Pain in right ankle and joints of right foot: Secondary | ICD-10-CM

## 2017-10-19 DIAGNOSIS — M7661 Achilles tendinitis, right leg: Secondary | ICD-10-CM

## 2017-10-19 NOTE — Progress Notes (Signed)
Subjective:    Patient ID: [name], male    DOB: [birth year], 62 y.o.   MRN: [id number]  HPI He has had pain in the right heel area near the Achilles tendon insertion for over a year.  He has retired as of January 31st. He has had less pain in his heel as he is not wearing dress shoes now.  He was a Customer service manager.  He had marked pain right after Christmas and was limping then.  He is concerned he has a "knot" at the Achilles insertion.  He had an episode of pain about a month ago.  He talked about it with Dr. Luan Pulling on his annual physical and Dr. Luan Pulling asked that he come here.  He has no trauma to the heel.  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  He takes an occasional Advil or Aleve when he has the pain and it helps a lot but he prefers not to take any medicine.  He is wearing loafers and tennis shoes now and feel this has helped relieve his pain.  He has no redness.  He has had some swelling in the past.   Review of Systems  Constitutional: Positive for activity change.  Musculoskeletal: Positive for arthralgias, back pain and gait problem.  All other systems reviewed and are negative.  History reviewed. No pertinent past medical history.  Past Surgical History:  Procedure Laterality Date  . BACK SURGERY    . COLONOSCOPY  06/23/2011   Procedure: COLONOSCOPY;  Surgeon: Rogene Houston, MD;  Location: [facility];  Service: Endoscopy;  Laterality: N/A;  3:00  . COLONOSCOPY N/A 07/15/2016   Procedure: COLONOSCOPY;  Surgeon: Rogene Houston, MD;  Location: [facility];  Service: Endoscopy;  Laterality: N/A;  830    Current Outpatient Medications on File Prior to Visit  Medication Sig Dispense Refill  . cyclobenzaprine (FLEXERIL) 10 MG tablet Take 1 tablet (10 mg total) by mouth 2 (two) times daily as needed for muscle spasms. 20 tablet 0  . famotidine (PEPCID) 20 MG tablet Take 1 tablet (20 mg total) by mouth 2 (two) times  daily. 20 tablet 0  . methylPREDNISolone (MEDROL DOSEPAK) 4 MG TBPK tablet Take according to label instructions 21 tablet 0  . Multiple Vitamin (MULTIVITAMIN) tablet Take 1 tablet by mouth daily.    . naproxen (NAPROSYN) 500 MG tablet Take 1 tablet (500 mg total) by mouth 2 (two) times daily. 20 tablet 0  . oxyCODONE-acetaminophen (PERCOCET/ROXICET) 5-325 MG tablet Take 1 tablet by mouth every 6 (six) hours as needed for severe pain. 8 tablet 0  . Propylene Glycol (SYSTANE BALANCE OP) Place 1-2 drops into both eyes daily as needed.     No current facility-administered medications on file prior to visit.     Social History   Socioeconomic History  . Marital status: Married    Spouse name: Not on file  . Number of children: Not on file  . Years of education: Not on file  . Highest education level: Not on file  Occupational History  . Not on file  Social Needs  . Financial resource strain: Not on file  . Food insecurity:    Worry: Not on file    Inability: Not on file  . Transportation needs:    Medical: Not on file    Non-medical: Not on file  Tobacco Use  . Smoking status:  Never Smoker  . Smokeless tobacco: Never Used  Substance and Sexual Activity  . Alcohol use: No  . Drug use: No  . Sexual activity: Yes    Birth control/protection: None  Lifestyle  . Physical activity:    Days per week: Not on file    Minutes per session: Not on file  . Stress: Not on file  Relationships  . Social connections:    Talks on phone: Not on file    Gets together: Not on file    Attends religious service: Not on file    Active member of club or organization: Not on file    Attends meetings of clubs or organizations: Not on file    Relationship status: Not on file  . Intimate partner violence:    Fear of current or ex partner: Not on file    Emotionally abused: Not on file    Physically abused: Not on file    Forced sexual activity: Not on file  Other Topics Concern  . Not on file    Social History Narrative  . Not on file    Family History  Problem Relation Age of Onset  . Cancer Father     BP 120/80   Pulse 99   Temp (!) 97.5 F (36.4 C)   Ht 6' 2.5" (1.892 m)   Wt 235 lb (106.6 kg)   BMI 29.77 kg/m      Objective:   Physical Exam  Constitutional: He is oriented to person, place, and time. He appears well-developed and well-nourished.  HENT:  Head: Normocephalic and atraumatic.  Eyes: Pupils are equal, round, and reactive to light. Conjunctivae and EOM are normal.  Neck: Normal range of motion. Neck supple.  Cardiovascular: Normal rate, regular rhythm and intact distal pulses.  Pulmonary/Chest: Effort normal.  Abdominal: Soft.  Musculoskeletal:       Right ankle: Tenderness. Achilles tendon exhibits pain and defect. Achilles tendon exhibits normal Thompson's test results.       Feet:  Neurological: He is alert and oriented to person, place, and time. He has normal reflexes. He displays normal reflexes. No cranial nerve deficit. He exhibits normal muscle tone. Coordination normal.  Skin: Skin is warm and dry.  Psychiatric: He has a normal mood and affect. His behavior is normal. Judgment and thought content normal.      X-rays were done of the right foot, reported separately.    Assessment & Plan:   Encounter Diagnoses  Name Primary?  . Pain in joint involving right ankle and foot Yes  . Achilles tendinitis of right lower extremity    I have shown him his x-rays and the prominence of the spur of the proximal heel.  He has developed an Achilles bursitis.  He has improved with not wearing his dress shoes all the time.  This may get slowly better over time.  I would not recommend any surgery at this time.  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  I have recommended ice and  Aspercreme to the area as needed.  Occasional Advil or Aleve as needed.  He needs to check his shoe wear for "wearing out" in the area of the "knot".  Check shoes before buying.  I will see as needed.  Call if any problem.  Precautions discussed.   Electronically  Signed Sanjuana Kava, MD 5/29/201910:53 AM

## 2018-01-07 ENCOUNTER — Encounter (HOSPITAL_COMMUNITY): Payer: Self-pay | Admitting: Emergency Medicine

## 2018-01-07 ENCOUNTER — Emergency Department (HOSPITAL_COMMUNITY)
Admission: EM | Admit: 2018-01-07 | Discharge: 2018-01-08 | Disposition: A | Discharge status: Home / Self Care | Payer: Self-pay | Attending: Emergency Medicine | Admitting: Emergency Medicine

## 2018-01-07 DIAGNOSIS — Z79899 Other long term (current) drug therapy: Secondary | ICD-10-CM | POA: Insufficient documentation

## 2018-01-07 DIAGNOSIS — N201 Calculus of ureter: Secondary | ICD-10-CM | POA: Insufficient documentation

## 2018-01-07 DIAGNOSIS — N23 Unspecified renal colic: Secondary | ICD-10-CM | POA: Insufficient documentation

## 2018-01-07 NOTE — ED Triage Notes (Signed)
Pt with c/o L sided flank pain, nausea, and diaphoresis starting approximately an hour and a half ago.

## 2018-01-08 ENCOUNTER — Emergency Department (HOSPITAL_COMMUNITY): Payer: Self-pay

## 2018-01-08 LAB — COMPREHENSIVE METABOLIC PANEL
ALK PHOS: 57 U/L (ref 38–126)
ALT: 31 U/L (ref 0–44)
ANION GAP: 10 (ref 5–15)
AST: 26 U/L (ref 15–41)
Albumin: 4.6 g/dL (ref 3.5–5.0)
BILIRUBIN TOTAL: 0.9 mg/dL (ref 0.3–1.2)
BUN: 23 mg/dL (ref 8–23)
CALCIUM: 9.3 mg/dL (ref 8.9–10.3)
CO2: 26 mmol/L (ref 22–32)
CREATININE: 1.22 mg/dL (ref 0.61–1.24)
Chloride: 106 mmol/L (ref 98–111)
GFR calc non Af Amer: >60 mL/min (ref >60)
Glucose, Bld: 135 mg/dL — ABNORMAL HIGH (ref 70–99)
Potassium: 3.6 mmol/L (ref 3.5–5.1)
Sodium: 142 mmol/L (ref 135–145)
Total Protein: 7.9 g/dL (ref 6.5–8.1)

## 2018-01-08 LAB — CBC WITH DIFFERENTIAL/PLATELET
Basophils Absolute: 0 10*3/uL (ref 0.0–0.1)
Basophils Relative: 1 %
EOS ABS: 0.3 10*3/uL (ref 0.0–0.7)
Eosinophils Relative: 4 %
HEMATOCRIT: 45.9 % (ref 39.0–52.0)
HEMOGLOBIN: 15.5 g/dL (ref 13.0–17.0)
LYMPHS ABS: 2.7 10*3/uL (ref 0.7–4.0)
LYMPHS PCT: 36 %
MCH: 30 pg (ref 26.0–34.0)
MCHC: 33.8 g/dL (ref 30.0–36.0)
MCV: 89 fL (ref 78.0–100.0)
MONOS PCT: 5 %
Monocytes Absolute: 0.4 10*3/uL (ref 0.1–1.0)
NEUTROS PCT: 54 %
Neutro Abs: 4.1 10*3/uL (ref 1.7–7.7)
Platelets: 229 10*3/uL (ref 150–400)
RBC: 5.16 MIL/uL (ref 4.22–5.81)
RDW: 12.8 % (ref 11.5–15.5)
WBC: 7.5 10*3/uL (ref 4.0–10.5)

## 2018-01-08 LAB — URINALYSIS, ROUTINE W REFLEX MICROSCOPIC
BILIRUBIN URINE: NEGATIVE
Bacteria, UA: NONE SEEN
Glucose, UA: 50 mg/dL — AB
Ketones, ur: 20 mg/dL — AB
Leukocytes, UA: NEGATIVE
Nitrite: NEGATIVE
PH: 5 (ref 5.0–8.0)
Protein, ur: NEGATIVE mg/dL
RBC / HPF: >50 RBC/hpf — ABNORMAL HIGH (ref 0–5)
SPECIFIC GRAVITY, URINE: 1.015 (ref 1.005–1.030)

## 2018-01-08 MED ORDER — SODIUM CHLORIDE 0.9 % IV BOLUS
1000.0000 mL | Freq: Once | INTRAVENOUS | Status: AC
  Administered 2018-01-08: 1000 mL via INTRAVENOUS

## 2018-01-08 MED ORDER — PROMETHAZINE HCL 25 MG/ML IJ SOLN
12.5000 mg | Freq: Once | INTRAMUSCULAR | Status: AC
  Administered 2018-01-08: 12.5 mg via INTRAVENOUS
  Filled 2018-01-08: qty 1

## 2018-01-08 MED ORDER — ONDANSETRON 4 MG PO TBDP: 4.0000 mg | ORAL_TABLET | Freq: Three times a day (TID) | ORAL | 0 refills | Status: DC | PRN

## 2018-01-08 MED ORDER — OXYCODONE-ACETAMINOPHEN 5-325 MG PO TABS
1.0000 | ORAL_TABLET | Freq: Once | ORAL | Status: AC
  Administered 2018-01-08: 1 via ORAL
  Filled 2018-01-08: qty 1

## 2018-01-08 MED ORDER — HYDROMORPHONE HCL 1 MG/ML IJ SOLN
1.0000 mg | Freq: Once | INTRAMUSCULAR | Status: AC
  Administered 2018-01-08: 1 mg via INTRAVENOUS
  Filled 2018-01-08: qty 1

## 2018-01-08 MED ORDER — IBUPROFEN 800 MG PO TABS: 800.0000 mg | ORAL_TABLET | Freq: Three times a day (TID) | ORAL | 0 refills | Status: DC | PRN

## 2018-01-08 MED ORDER — TAMSULOSIN HCL 0.4 MG PO CAPS: 0.4000 mg | ORAL_CAPSULE | Freq: Every day | ORAL | 0 refills | Status: DC

## 2018-01-08 MED ORDER — ONDANSETRON HCL 4 MG/2ML IJ SOLN
4.0000 mg | Freq: Once | INTRAMUSCULAR | Status: AC
  Administered 2018-01-08: 4 mg via INTRAVENOUS
  Filled 2018-01-08: qty 2

## 2018-01-08 MED ORDER — KETOROLAC TROMETHAMINE 30 MG/ML IJ SOLN
30.0000 mg | Freq: Once | INTRAMUSCULAR | Status: AC
  Administered 2018-01-08: 30 mg via INTRAVENOUS
  Filled 2018-01-08: qty 1

## 2018-01-08 MED ORDER — OXYCODONE-ACETAMINOPHEN 5-325 MG PO TABS: 1.0000 | ORAL_TABLET | ORAL | 0 refills | Status: DC | PRN

## 2018-01-08 NOTE — ED Notes (Signed)
Asked pt for urine sample,pt can't go right now but urinal at bedside.

## 2018-01-08 NOTE — ED Provider Notes (Signed)
[facility] EMERGENCY DEPARTMENT Provider Note   CSN: [id number] Arrival date & time: 01/07/18  2344     History   Chief Complaint Chief Complaint  Patient presents with  . Flank Pain    HPI [name] is a 62 y.o. male.  Patient with L sided flank pain that onset as he was lying in bed acutely. Denies fall or injury. Hx back problems with surgery in the past but never had this kind of pain. Pain radiates to L abdomen. Associated with nausea and diaphoresis. No vomiting. No fever. No dysuria or hematuria. No focal weakness, numbness, tingling. No diarrhea. No history of kidney stone.  No chest pain or SOB.  The history is provided by the patient.  Flank Pain  Pertinent negatives include no chest pain, no abdominal pain, no headaches and no shortness of breath.    History reviewed. No pertinent past medical history.  Patient Active Problem List   Diagnosis Date Noted  . Family hx of colon cancer 06/11/2016  . Hx of adenomatous colonic polyps 06/11/2016    Past Surgical History:  Procedure Laterality Date  . BACK SURGERY    . COLONOSCOPY  06/23/2011   Procedure: COLONOSCOPY;  Surgeon: Rogene Houston, MD;  [facility]: [facility];  Service: Endoscopy;  Laterality: N/A;  3:00  . COLONOSCOPY N/A 07/15/2016   Procedure: COLONOSCOPY;  Surgeon: Rogene Houston, MD;  Location: [facility];  Service: Endoscopy;  Laterality: N/A;  830        Home Medications    Prior to Admission medications   Medication Sig Start Date End Date Taking? Authorizing Provider  cyclobenzaprine (FLEXERIL) 10 MG tablet Take 1 tablet (10 mg total) by mouth 2 (two) times daily as needed for muscle spasms. 02/12/17   Quintella Reichert, MD  famotidine (PEPCID) 20 MG tablet Take 1 tablet (20 mg total) by mouth 2 (two) times daily. 02/12/17   Quintella Reichert, MD  methylPREDNISolone (MEDROL DOSEPAK) 4 MG TBPK tablet Take according to label instructions 02/12/17   Quintella Reichert, MD  Multiple Vitamin  (MULTIVITAMIN) tablet Take 1 tablet by mouth daily.    [provider]  naproxen (NAPROSYN) 500 MG tablet Take 1 tablet (500 mg total) by mouth 2 (two) times daily. 02/12/17   Quintella Reichert, MD  oxyCODONE-acetaminophen (PERCOCET/ROXICET) 5-325 MG tablet Take 1 tablet by mouth every 6 (six) hours as needed for severe pain. 02/12/17   Quintella Reichert, MD  Propylene Glycol (SYSTANE BALANCE OP) Place 1-2 drops into both eyes daily as needed.    [provider]    Family History Family History  Problem Relation Age of Onset  . Cancer Father     Social History Social History   Tobacco Use  . Smoking status: Never Smoker  . Smokeless tobacco: Never Used  Substance Use Topics  . Alcohol use: No  . Drug use: No     Allergies   Patient has no known allergies.   Review of Systems Review of Systems  Constitutional: Negative for activity change, appetite change and fever.  HENT: Negative for congestion, facial swelling, sneezing, sore throat and trouble swallowing.   Eyes: Negative for visual disturbance.  Respiratory: Negative for cough, chest tightness and shortness of breath.   Cardiovascular: Negative for chest pain.  Gastrointestinal: Positive for nausea. Negative for abdominal pain.  Genitourinary: Positive for flank pain. Negative for difficulty urinating, dysuria, hematuria and scrotal swelling.  Musculoskeletal: Positive for back pain.  Skin: Negative for  rash.  Neurological: Negative for dizziness, seizures, weakness and headaches.   all other systems are negative except as noted in the HPI and PMH.     Physical Exam Updated Vital Signs BP 132/71 (BP Location: Left Arm)   Pulse 73   Temp (!) 97.5 F (36.4 C) (Oral)   Resp 18   Ht 6\' 2"  (1.88 m)   Wt 102.1 kg   SpO2 100%   BMI 28.89 kg/m   Physical Exam  Constitutional: He is oriented to person, place, and time. He appears well-developed and well-nourished. He appears distressed.  uncomfortable   HENT:  Head: Normocephalic and atraumatic.  Mouth/Throat: Oropharynx is clear and moist. No oropharyngeal exudate.  Eyes: Pupils are equal, round, and reactive to light. Conjunctivae and EOM are normal.  Neck: Normal range of motion. Neck supple.  No meningismus.  Cardiovascular: Normal rate, regular rhythm, normal heart sounds and intact distal pulses.  No murmur heard. Pulmonary/Chest: Effort normal and breath sounds normal. No respiratory distress.  Abdominal: Soft. There is tenderness. There is no rebound and no guarding.  TTP LLQ. No guarding or rebound  Genitourinary:  Genitourinary Comments: No testicular tenderness  Musculoskeletal: Normal range of motion. He exhibits tenderness. He exhibits no edema.  L CVAT  Neurological: He is alert and oriented to person, place, and time. No cranial nerve deficit. He exhibits normal muscle tone. Coordination normal.   5/5 strength throughout. CN 2-12 intact.Equal grip strength.   Skin: Skin is warm.  Psychiatric: He has a normal mood and affect. His behavior is normal.  Nursing note and vitals reviewed.    ED Treatments / Results  Labs (all labs ordered are listed, but only abnormal results are displayed) Labs Reviewed  URINALYSIS, ROUTINE W REFLEX MICROSCOPIC - Abnormal; Notable for the following components:      Result Value   Glucose, UA 50 (*)    Hgb urine dipstick LARGE (*)    Ketones, ur 20 (*)    RBC / HPF >50 (*)    All other components within normal limits  COMPREHENSIVE METABOLIC PANEL - Abnormal; Notable for the following components:   Glucose, Bld 135 (*)    All other components within normal limits  CBC WITH DIFFERENTIAL/PLATELET    EKG None  Radiology Ct Renal Stone Study  Result Date: 01/08/2018 CLINICAL DATA:  Left flank pain, nausea, and diaphoresis beginning in our and half ago. EXAM: CT ABDOMEN AND PELVIS WITHOUT CONTRAST TECHNIQUE: Multidetector CT imaging of the abdomen and pelvis was performed  following the standard protocol without IV contrast. COMPARISON:  None. FINDINGS: Lower chest: Mild dependent changes in the lung bases. Hepatobiliary: Vague poorly defined low-attenuation foci in the right lobe of the liver, possibly representing a central scar within the circumscribed mass, vaguely suggested. Limited characterization is available on noncontrast imaging. Consider focal nodular hyperplasia. Suggest follow-up with contrast-enhanced CT or MRI for further characterization. Gallbladder and bile ducts are unremarkable. Pancreas: Unremarkable. No pancreatic ductal dilatation or surrounding inflammatory changes. Spleen: Normal in size without focal abnormality. Adrenals/Urinary Tract: Kidneys are symmetrical in size. Mild left hydronephrosis and hydroureter. 3 mm stone in the distal left ureter at the level of the sacrum. No additional stones are demonstrated. Bladder is unremarkable. Stomach/Bowel: Stomach is within normal limits. Appendix is not identified. No evidence of bowel wall thickening, distention, or inflammatory changes. Vascular/Lymphatic: No significant vascular findings are present. No enlarged abdominal or pelvic lymph nodes. Reproductive: Prominent enlargement of the prostate measuring 6.4 cm diameter.  Other: Small left inguinal hernia containing fat. No free air or free fluid in the abdomen. Abdominal wall musculature appears intact. Musculoskeletal: No acute or significant osseous findings. IMPRESSION: 1. 3 mm stone in the distal left ureter with mild proximal obstruction. 2. Indeterminate low-attenuation lesions in the right lobe of the liver. Suggest follow-up with contrast-enhanced CT or MRI for further characterization. 3. Enlarged prostate gland. Electronically Signed   By: Lucienne Capers M.D.   On: 01/08/2018 00:51    Procedures Procedures (including critical care time)  Medications Ordered in ED Medications  sodium chloride 0.9 % bolus 1,000 mL (has no administration in  time range)  HYDROmorphone (DILAUDID) injection 1 mg (has no administration in time range)  ondansetron (ZOFRAN) injection 4 mg (has no administration in time range)  ketorolac (TORADOL) 30 MG/ML injection 30 mg (has no administration in time range)     Initial Impression / Assessment and Plan / ED Course  I have reviewed the triage vital signs and the nursing notes.  Pertinent labs & imaging results that were available during my care of the patient were reviewed by me and considered in my medical decision making (see chart for details).    L flank pain with nausea. Distal pulses intact.  Concern for kidney stone.  Patient given IV fluids and symptom control.  UA with hematuria. No infection.  CT with distal 40mm L ureteral stone.  Creatinine is normal.  Patient's pain is controlled in the ED and he is tolerating p.o.  We will treat his symptoms with pain medication, Zofran and Flomax.  Advise follow-up with urology.  Return precautions discussed including worsening pain, inability to urinate, vomiting, fever or any other concerns. Final Clinical Impressions(s) / ED Diagnoses   Final diagnoses:  Ureteral colic    ED Discharge Orders    None       Rhanda Lemire, Annie Main, MD 01/08/18 [id number]

## 2018-01-08 NOTE — Discharge Instructions (Addendum)
Take the pain medication as prescribed and follow-up with the urologist.  Return to the ED if you have worsening pain, vomiting or fever or any other concerns.

## 2018-01-26 ENCOUNTER — Ambulatory Visit (INDEPENDENT_AMBULATORY_CARE_PROVIDER_SITE_OTHER): Payer: Self-pay | Admitting: Otolaryngology

## 2018-01-26 DIAGNOSIS — H6123 Impacted cerumen, bilateral: Secondary | ICD-10-CM

## 2018-01-26 DIAGNOSIS — H903 Sensorineural hearing loss, bilateral: Secondary | ICD-10-CM

## 2018-10-26 ENCOUNTER — Other Ambulatory Visit: Payer: Self-pay

## 2018-10-26 ENCOUNTER — Ambulatory Visit (INDEPENDENT_AMBULATORY_CARE_PROVIDER_SITE_OTHER): Payer: Self-pay | Admitting: Otolaryngology

## 2018-10-26 DIAGNOSIS — H903 Sensorineural hearing loss, bilateral: Secondary | ICD-10-CM

## 2018-10-26 DIAGNOSIS — H6123 Impacted cerumen, bilateral: Secondary | ICD-10-CM

## 2018-11-06 IMAGING — CT CT RENAL STONE PROTOCOL
2 of 4 series · 16 of 46 positions shown, 18 images · non-contrast
Comparison: None.

CLINICAL DATA: Left flank pain, nausea, and diaphoresis beginning
in our and half ago.

EXAM:
CT ABDOMEN AND PELVIS WITHOUT CONTRAST
TECHNIQUE: Multidetector CT imaging of the abdomen and pelvis was performed
following the standard protocol without IV contrast.

[Series 2: axial st · axial · 0.86mm/px · z∈[+970,+1454]mm · 13 of 107 slices shown, 15 images]
[im 5/107  soft-tissue]
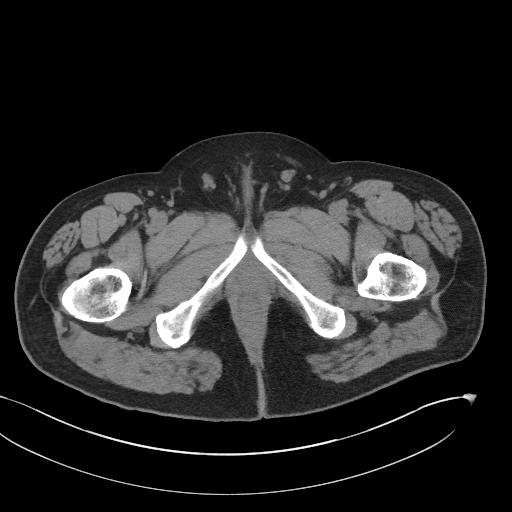
[im 5/107  bone]
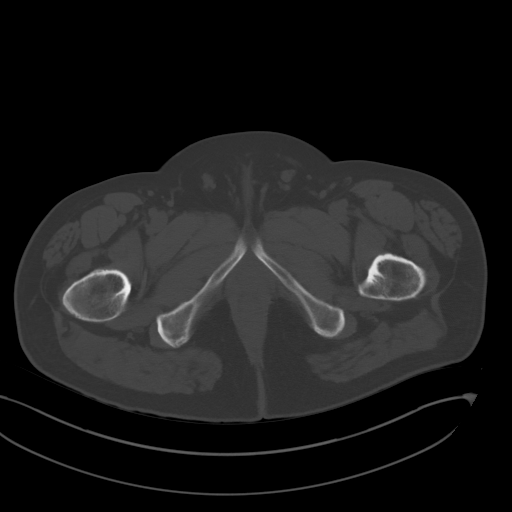
[im 14/107  soft-tissue]
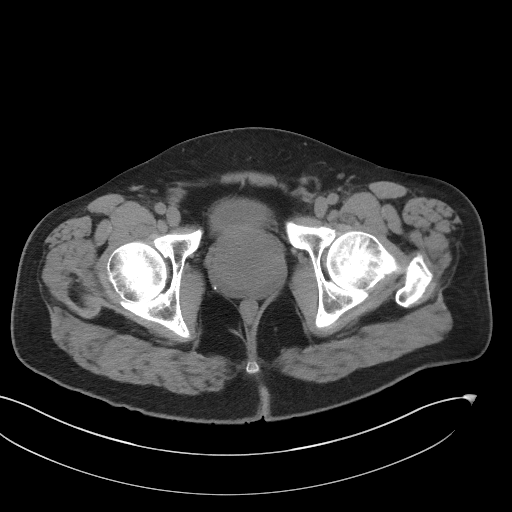
[im 23/107  soft-tissue]
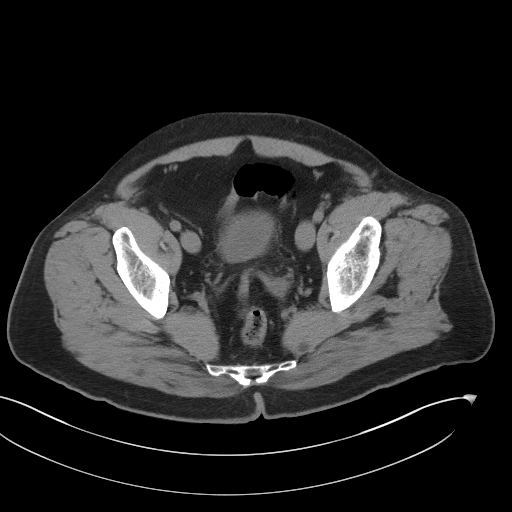
[im 31/107  soft-tissue]
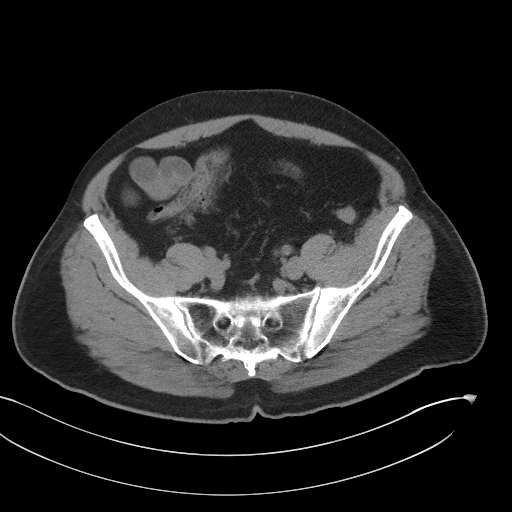
[im 36/107  soft-tissue]
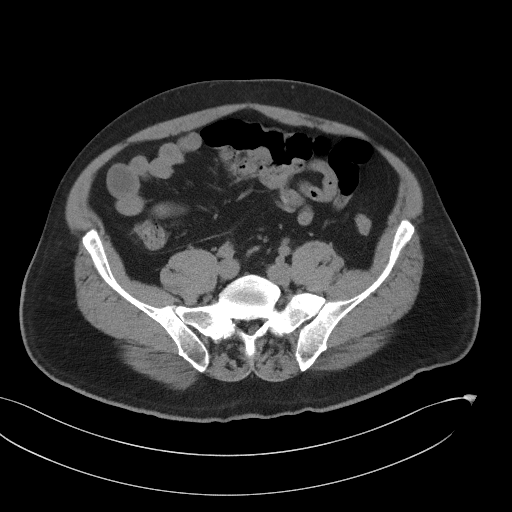
[im 45/107  soft-tissue]
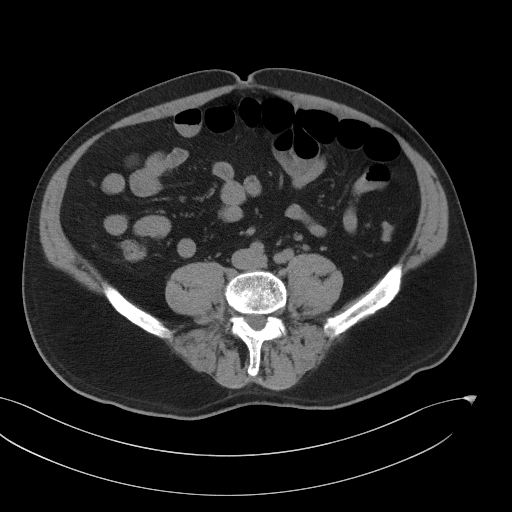
[im 54/107  soft-tissue]
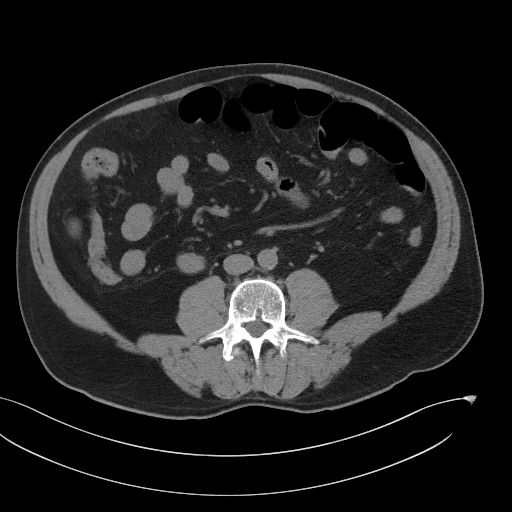
[im 62/107  soft-tissue]
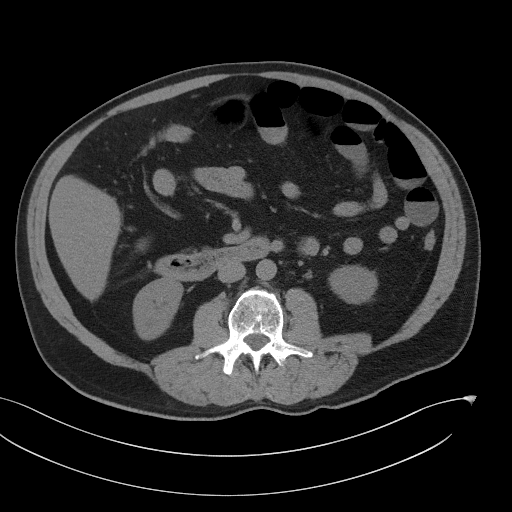
[im 71/107  soft-tissue]
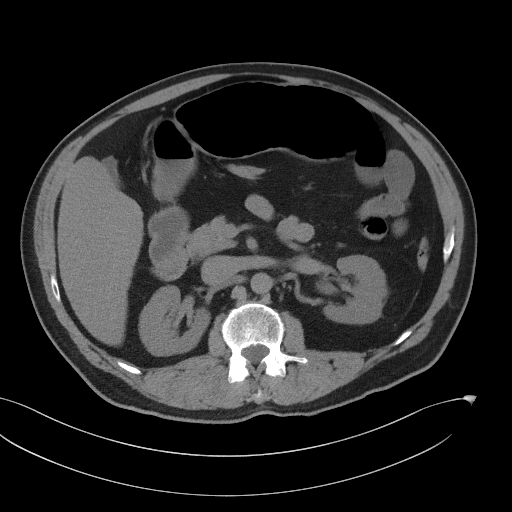
[im 71/107  bone]
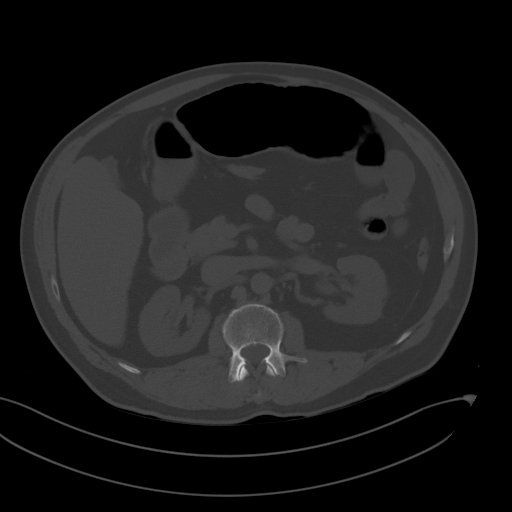
[im 76/107  soft-tissue]
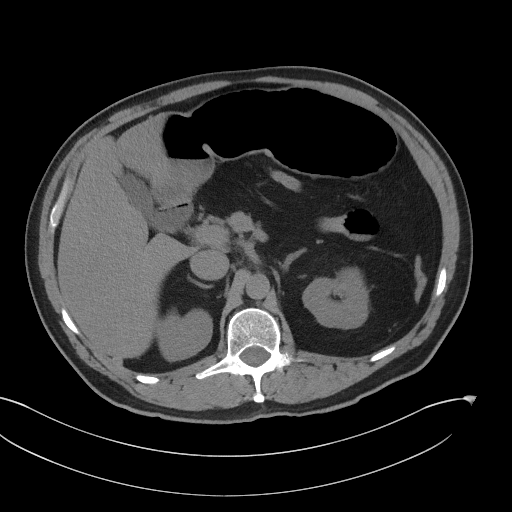
[im 84/107  soft-tissue]
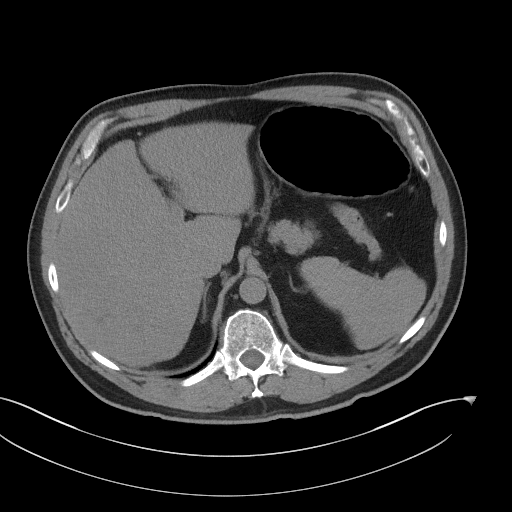
[im 93/107  soft-tissue]
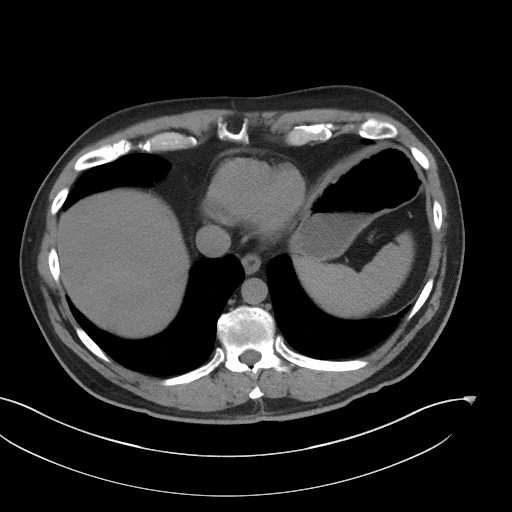
[im 102/107  soft-tissue]
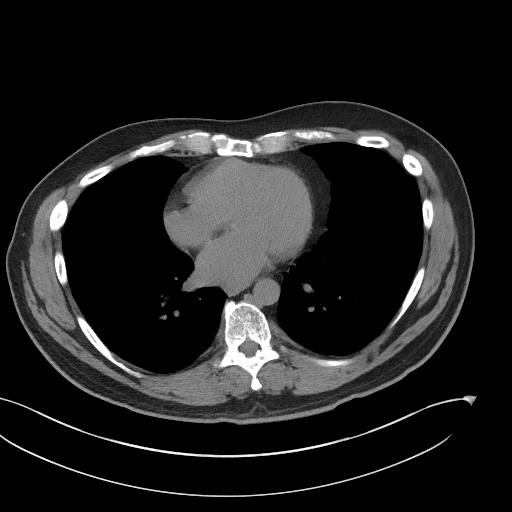

[Series 5: coronal st · coronal · 0.89mm/px · 3 of 106 slices shown]
[im 36/106  soft-tissue]
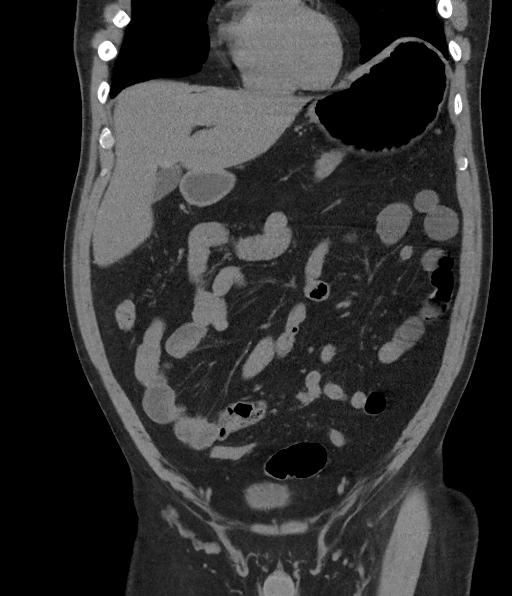
[im 47/106  soft-tissue]
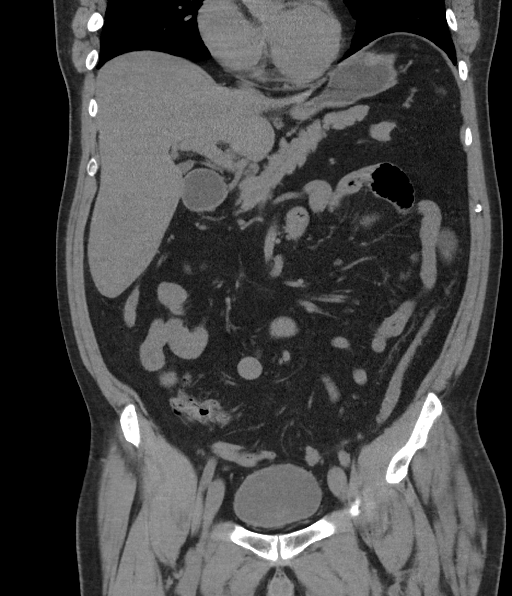
[im 59/106  soft-tissue]
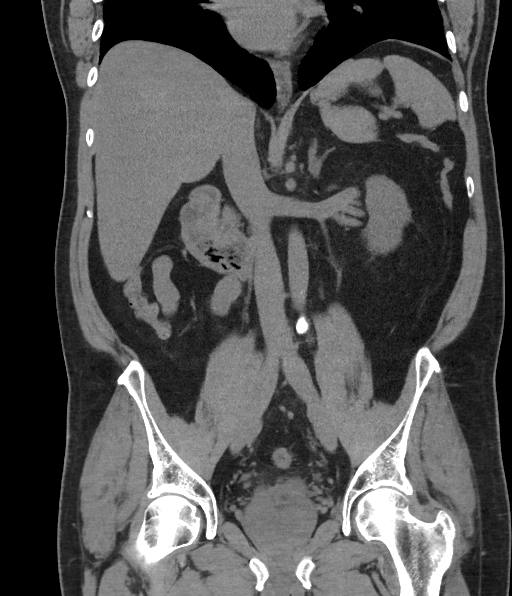

[16 of 46 positions shown; findings below may reference images not displayed]

FINDINGS: Lower chest: Mild dependent changes in the lung bases.

Hepatobiliary: Vague poorly defined low-attenuation foci in the
right lobe of the liver, possibly representing a central scar within
the circumscribed mass, vaguely suggested. Limited characterization
is available on noncontrast imaging. Consider focal nodular
hyperplasia. Suggest follow-up with contrast-enhanced CT or MRI for
further characterization. Gallbladder and bile ducts are
unremarkable.

Pancreas: Unremarkable. No pancreatic ductal dilatation or
surrounding inflammatory changes.

Spleen: Normal in size without focal abnormality.

Adrenals/Urinary Tract: Kidneys are symmetrical in size. Mild left
hydronephrosis and hydroureter. 3 mm stone in the distal left ureter
at the level of the sacrum. No additional stones are demonstrated.
Bladder is unremarkable.

Stomach/Bowel: Stomach is within normal limits. Appendix is not
identified. No evidence of bowel wall thickening, distention, or
inflammatory changes.

Vascular/Lymphatic: No significant vascular findings are present. No
enlarged abdominal or pelvic lymph nodes.

Reproductive: Prominent enlargement of the prostate measuring 6.4 cm
diameter.

Other: Small left inguinal hernia containing fat. No free air or
free fluid in the abdomen. Abdominal wall musculature appears
intact.

Musculoskeletal: No acute or significant osseous findings.
IMPRESSION: 1. 3 mm stone in the distal left ureter with mild proximal
obstruction.
2. Indeterminate low-attenuation lesions in the right lobe of the
liver. Suggest follow-up with contrast-enhanced CT or MRI for
further characterization.
3. Enlarged prostate gland.

## 2019-06-06 ENCOUNTER — Ambulatory Visit: Payer: PRIVATE HEALTH INSURANCE | Attending: Internal Medicine

## 2019-06-06 ENCOUNTER — Other Ambulatory Visit: Payer: Self-pay

## 2019-06-06 DIAGNOSIS — Z20822 Contact with and (suspected) exposure to covid-19: Secondary | ICD-10-CM | POA: Insufficient documentation

## 2019-06-07 LAB — NOVEL CORONAVIRUS, NAA: SARS-CoV-2, NAA: NOT DETECTED

## 2020-02-12 ENCOUNTER — Ambulatory Visit (INDEPENDENT_AMBULATORY_CARE_PROVIDER_SITE_OTHER): Payer: PRIVATE HEALTH INSURANCE | Admitting: Urology

## 2020-02-12 ENCOUNTER — Encounter: Payer: Self-pay | Admitting: Urology

## 2020-02-12 ENCOUNTER — Other Ambulatory Visit: Payer: Self-pay

## 2020-02-12 VITALS — BP 124/80 | HR 85 | Temp 98.0°F | Ht 75.0 in | Wt 225.0 lb

## 2020-02-12 DIAGNOSIS — R972 Elevated prostate specific antigen [PSA]: Secondary | ICD-10-CM

## 2020-02-12 LAB — MICROSCOPIC EXAMINATION
Bacteria, UA: NONE SEEN
Epithelial Cells (non renal): NONE SEEN /HPF (ref 0–10)
Renal Epithel, UA: NONE SEEN /HPF
WBC, UA: NONE SEEN /HPF (ref 0–5)

## 2020-02-12 LAB — URINALYSIS, ROUTINE W REFLEX MICROSCOPIC
Bilirubin, UA: NEGATIVE
Glucose, UA: NEGATIVE
Ketones, UA: NEGATIVE
Leukocytes,UA: NEGATIVE
Nitrite, UA: NEGATIVE
Protein,UA: NEGATIVE
Specific Gravity, UA: >1.03 — ABNORMAL HIGH (ref 1.005–1.030)
Urobilinogen, Ur: 0.2 mg/dL (ref 0.2–1.0)
pH, UA: 5 (ref 5.0–7.5)

## 2020-02-12 NOTE — Progress Notes (Signed)
H&P  Chief Complaint: Elevated PSA  History of Present Illness:  9.21.2021: Pt reports that his PSA has been trending upward over the past few years and most recently was measured around 4-5 (mid-June). Pt notes that approximately 2 years ago he started experiencing increased urinary frequency and terminal dribbling but denies any other LUTS. Pt has taken tamsulosin in the past and notes that it may have helped with his urinary frequency but he is uncertain.  Pt denies any ED related symptomatology at this time.  Pt denies any family history of PCa  No past medical history on file.  Past Surgical History:  Procedure Laterality Date  . BACK SURGERY    . COLONOSCOPY  06/23/2011   Procedure: COLONOSCOPY;  Surgeon: Rogene Houston, MD;  [facility];  Service: Endoscopy;  Laterality: N/A;  3:00  . COLONOSCOPY N/A 07/15/2016   Procedure: COLONOSCOPY;  Surgeon: Rogene Houston, MD;  Location: [facility];  Service: Endoscopy;  Laterality: N/A;  830    Home Medications:  Allergies as of 02/12/2020   No Known Allergies     Medication List       Accurate as of February 12, 2020  1:22 PM. If you have any questions, ask your nurse or doctor.        cyclobenzaprine 10 MG tablet Commonly known as: FLEXERIL Take 1 tablet (10 mg total) by mouth 2 (two) times daily as needed for muscle spasms.   famotidine 20 MG tablet Commonly known as: PEPCID Take 1 tablet (20 mg total) by mouth 2 (two) times daily.   ibuprofen 800 MG tablet Commonly known as: ADVIL Take 1 tablet (800 mg total) by mouth every 8 (eight) hours as needed for moderate pain.   methylPREDNISolone 4 MG Tbpk tablet Commonly known as: MEDROL DOSEPAK Take according to label instructions   multivitamin tablet Take 1 tablet by mouth daily.   naproxen 500 MG tablet Commonly known as: NAPROSYN Take 1 tablet (500 mg total) by mouth 2 (two) times daily.   ondansetron 4 MG disintegrating tablet Commonly known  as: Zofran ODT Take 1 tablet (4 mg total) by mouth every 8 (eight) hours as needed for nausea or vomiting.   oxyCODONE-acetaminophen 5-325 MG tablet Commonly known as: PERCOCET/ROXICET Take 1 tablet by mouth every 4 (four) hours as needed for severe pain.   SYSTANE BALANCE OP Place 1-2 drops into both eyes daily as needed.   tamsulosin 0.4 MG Caps capsule Commonly known as: Flomax Take 1 capsule (0.4 mg total) by mouth daily.       Allergies: No Known Allergies  Family History  Problem Relation Age of Onset  . Cancer Father     Social History:  reports that he has never smoked. He has never used smokeless tobacco. He reports that he does not drink alcohol and does not use drugs.  ROS: A complete review of systems was performed.  All systems are negative except for pertinent findings as noted.  Physical Exam:  Vital signs in last 24 hours: There were no vitals taken for this visit. Constitutional:  Alert and oriented, No acute distress Cardiovascular: Regular rate  Respiratory: Normal respiratory effort GI: Abdomen is soft, nontender, nondistended, no abdominal masses. No CVAT. No hernia Genitourinary: Normal male phallus, testes are descended bilaterally and non-tender and without masses, scrotum is normal in appearance without lesions or masses, perineum is normal on inspection. Prostate feels about 80 grams. Neurologic: Grossly intact, no focal deficits Psychiatric: Normal  mood and affect  I have reviewed prior pt notes   I have reviewed urinalysis results    Impression/Assessment:  History of elevated PSA, although I do not have any true results.  We will call for these.  He does have modest enlargement of his prostate and a nonsuspicious DRE.  Plan:  1. Request lab records from Dr. Allyn Kenner as well as Dr. Luan Pulling  2. PSA will be drawn today.  3.  Urethral stripping to help his post void dribbling  4. Pt advised regarding dietary modifications to reduce  the formation of kidney stones: increase water/citrus intake, decrease sodium intake and reduce animal protein portions.  5. F/U in 1 year for OV, PSA, and DRE (designated interval contingent upon impending PSA results)  CC: Dr. Allyn Kenner

## 2020-02-12 NOTE — Progress Notes (Signed)
Urological Symptom Review  Patient is experiencing the following symptoms: Frequent urination Get up at night to urinate Stream starts and stops   Review of Systems  Gastrointestinal (upper)  : Negative for upper GI symptoms  Gastrointestinal (lower) : Negative for lower GI symptoms  Constitutional : Negative for symptoms  Skin: None  Eyes: Negative for eye symptoms  Ear/Nose/Throat : Negative for Ear/Nose/Throat symptoms  Hematologic/Lymphatic: Negative for Hematologic/Lymphatic symptoms  Cardiovascular : Negative for cardiovascular symptoms  Respiratory : Negative for respiratory symptoms  Endocrine: Negative for endocrine symptoms  Musculoskeletal: Negative for musculoskeletal symptoms  Neurological: Negative for neurological symptoms  Psychologic: Negative for psychiatric symptoms

## 2020-02-13 ENCOUNTER — Other Ambulatory Visit: Payer: Self-pay

## 2020-02-13 LAB — PSA: Prostate Specific Ag, Serum: 5.5 ng/mL — ABNORMAL HIGH (ref 0.0–4.0)

## 2020-02-19 ENCOUNTER — Other Ambulatory Visit: Payer: Self-pay

## 2020-02-19 ENCOUNTER — Other Ambulatory Visit: Payer: Self-pay | Admitting: Urology

## 2020-02-19 ENCOUNTER — Telehealth: Payer: Self-pay

## 2020-02-19 DIAGNOSIS — R972 Elevated prostate specific antigen [PSA]: Secondary | ICD-10-CM

## 2020-02-19 MED ORDER — LEVOFLOXACIN 750 MG PO TABS: 750.0000 mg | ORAL_TABLET | Freq: Every day | ORAL | 0 refills | Status: AC

## 2020-02-19 NOTE — Telephone Encounter (Signed)
Pt called and made aware

## 2020-02-19 NOTE — Telephone Encounter (Signed)
-----   Message from Franchot Gallo, MD sent at 02/19/2020 12:13 PM EDT ----- Regarding: RE: Prostate biopsy I put referral order in as well as Levaquin. ----- Message ----- From: Iris Pert, LPN Sent: 6/81/2751  11:02 AM EDT To: Franchot Gallo, MD, Valentina Lucks, LPN, # Subject: Prostate biopsy                                Pt insurance recommends out pt office setting. Could we send him to [facility] for is prostate biopsy. ----- Message ----- From: Franchot Gallo, MD Sent: 02/19/2020  10:06 AM EDT To: Valentina Lucks, LPN, Alfretta Pinch Belenda Cruise, LPN, #  Notify patient-PSA is 5.6.  With this upward trend, and his age, I would recommend ultrasound and biopsy of the prostate.  If he would like to proceed, get this scheduled and I will put orders in. ----- Message ----- From: Dorisann Frames, RN Sent: 02/13/2020   8:34 AM EDT To: Franchot Gallo, MD  Please review

## 2020-02-28 ENCOUNTER — Telehealth: Payer: Self-pay

## 2020-02-28 NOTE — Telephone Encounter (Signed)
Pt was notified trying to get referred to [facility].

## 2020-02-28 NOTE — Telephone Encounter (Signed)
-----   Message from Valentina Lucks, LPN sent at 28/06/599 11:31 AM EDT ----- I left message for pt to call saying that we were seeing if pt could get biopsy done in [place] since his insurance wants in office and [facility] does not honor his insurance.

## 2020-02-28 NOTE — Telephone Encounter (Signed)
Notified [name] that we had sent a message to [facility] to see if his biopsy can be done there because [facility] does not take his insurance.

## 2020-03-06 ENCOUNTER — Encounter: Payer: Self-pay | Admitting: Family Medicine

## 2020-03-11 ENCOUNTER — Other Ambulatory Visit: Payer: PRIVATE HEALTH INSURANCE | Admitting: Urology

## 2020-03-11 NOTE — Progress Notes (Signed)
   03/12/2020  CC:  Chief Complaint  Patient presents with  . Prostate Biopsy    [name] is a 64 y.o. male with a history of elevated PSA who presents today for a prostate biopsy.   The patient is followed by Dr. Diona Fanti in urology. He last saw Dr. Diona Fanti on 02/12/2020. Patient reported that his PSA has been trending upward over the past few years and most recently was measured around 4-5 (mid-June). Pt noted that approximately 2 years ago he started experiencing increased urinary frequency and terminal dribbling but denies any other LUTS. Pt had taken tamsulosin in the past and noted that it may have helped with his urinary frequency but he was uncertain. He had nocturia. His had trouble with his stream stopping and starting. Urethral stripping was given to help his post void dribbling. DRE noted a 80 g prostate.    Pt denied any ED related symptomatology at this time.  Pt denies any family history of PCa.  No past medical history on file.   Blood pressure 125/86, pulse 69, height 6\' 3"  (1.905 m), weight 225 lb (102.1 kg). NED. A&Ox3.   No respiratory distress   Abd soft, NT, ND Normal sphincter tone  Prostate Biopsy Procedure   Informed consent was obtained after discussing risks/benefits of the procedure.  A time out was performed to ensure correct patient identity.  Pre-Procedure: - Last PSA Level: 5.5 ng/mL on 02/12/2020 - Gentamicin given prophylactically - Levaquin 500 mg administered PO -Transrectal Ultrasound performed revealing a 171 gm prostate -Hypoechoic or areas in the midline and left base, nodular structures probably representing BPH nodules. No median lobe noted  Procedure: - Prostate block performed using 10 cc 1% lidocaine and biopsies taken from sextant areas, a total of 12 under ultrasound guidance.  Post-Procedure: - Patient tolerated the procedure well - He was counseled to seek immediate medical attention if experiences any severe  pain, significant bleeding, or fevers - Return in one week to discuss biopsy results  Assessment/ Plan:  1. History of elevated PSA  Return in one week to discuss biopsy results and subsequently follow up with Dr. Diona Fanti afterwards.    Hollice Espy, MD

## 2020-03-12 ENCOUNTER — Other Ambulatory Visit: Payer: Self-pay

## 2020-03-12 ENCOUNTER — Ambulatory Visit (INDEPENDENT_AMBULATORY_CARE_PROVIDER_SITE_OTHER): Payer: PRIVATE HEALTH INSURANCE | Admitting: Urology

## 2020-03-12 VITALS — BP 125/86 | HR 69 | Ht 75.0 in | Wt 225.0 lb

## 2020-03-12 DIAGNOSIS — R972 Elevated prostate specific antigen [PSA]: Secondary | ICD-10-CM | POA: Diagnosis not present

## 2020-03-12 MED ORDER — LEVOFLOXACIN 500 MG PO TABS
500.0000 mg | ORAL_TABLET | Freq: Once | ORAL | Status: AC
  Administered 2020-03-12: 500 mg via ORAL

## 2020-03-12 MED ORDER — GENTAMICIN SULFATE 40 MG/ML IJ SOLN
80.0000 mg | Freq: Once | INTRAMUSCULAR | Status: AC
  Administered 2020-03-12: 80 mg via INTRAMUSCULAR

## 2020-03-13 LAB — SURGICAL PATHOLOGY

## 2020-03-16 ENCOUNTER — Encounter: Payer: Self-pay | Admitting: Urology

## 2020-03-18 ENCOUNTER — Ambulatory Visit: Payer: PRIVATE HEALTH INSURANCE | Admitting: Urology

## 2020-03-25 ENCOUNTER — Ambulatory Visit: Payer: PRIVATE HEALTH INSURANCE | Admitting: Urology

## 2020-12-08 DIAGNOSIS — H5203 Hypermetropia, bilateral: Secondary | ICD-10-CM | POA: Diagnosis not present

## 2021-01-09 DIAGNOSIS — H524 Presbyopia: Secondary | ICD-10-CM | POA: Diagnosis not present

## 2021-02-09 DIAGNOSIS — L738 Other specified follicular disorders: Secondary | ICD-10-CM | POA: Diagnosis not present

## 2021-02-09 DIAGNOSIS — S80861A Insect bite (nonvenomous), right lower leg, initial encounter: Secondary | ICD-10-CM | POA: Diagnosis not present

## 2021-02-09 DIAGNOSIS — L821 Other seborrheic keratosis: Secondary | ICD-10-CM | POA: Diagnosis not present

## 2021-02-09 DIAGNOSIS — L918 Other hypertrophic disorders of the skin: Secondary | ICD-10-CM | POA: Diagnosis not present

## 2021-02-10 ENCOUNTER — Other Ambulatory Visit: Payer: Self-pay

## 2021-02-10 ENCOUNTER — Encounter: Payer: Self-pay | Admitting: Urology

## 2021-02-10 ENCOUNTER — Ambulatory Visit (INDEPENDENT_AMBULATORY_CARE_PROVIDER_SITE_OTHER): Payer: Medicare Other | Admitting: Urology

## 2021-02-10 VITALS — BP 136/78 | HR 75 | Ht 74.0 in | Wt 222.0 lb

## 2021-02-10 DIAGNOSIS — R972 Elevated prostate specific antigen [PSA]: Secondary | ICD-10-CM

## 2021-02-10 LAB — BLADDER SCAN AMB NON-IMAGING: Scan Result: 106

## 2021-02-10 LAB — URINALYSIS, ROUTINE W REFLEX MICROSCOPIC
Bilirubin, UA: NEGATIVE
Glucose, UA: NEGATIVE
Ketones, UA: NEGATIVE
Leukocytes,UA: NEGATIVE
Nitrite, UA: NEGATIVE
Protein,UA: NEGATIVE
RBC, UA: NEGATIVE
Specific Gravity, UA: 1.02 (ref 1.005–1.030)
Urobilinogen, Ur: 0.2 mg/dL (ref 0.2–1.0)
pH, UA: 7 (ref 5.0–7.5)

## 2021-02-10 NOTE — Progress Notes (Signed)
History of Present Illness:   [name] follows up for elevated PSA.  Because of a PSA of 5.5 in September 2021 [name] underwent ultrasound and biopsy of his prostate in [facility] by Dr. Erlene Quan.  Prostate was found to be 170 mL.  PSA density 0.03.  All 12 cores were negative.  Even with his large prostate, [name] has minimal urinary symptomatology although [name] is on tamsulosin.  [name] has not had PSA drawn recently.  Post void urinary volume is 106 mL  Past Surgical History:  Procedure Laterality Date   BACK SURGERY     COLONOSCOPY  06/23/2011   Procedure: COLONOSCOPY;  Surgeon: Rogene Houston, MD;  [facility];  Service: Endoscopy;  Laterality: N/A;  3:00   COLONOSCOPY N/A 07/15/2016   Procedure: COLONOSCOPY;  Surgeon: Rogene Houston, MD;  [facility]: [facility];  Service: Endoscopy;  Laterality: N/A;  830    Home Medications:  Allergies as of 02/10/2021   No Known Allergies      Medication List        Accurate as of February 10, 2021 12:24 PM. If you have any questions, ask your nurse or doctor.          multivitamin tablet Take 1 tablet by mouth daily.   tamsulosin 0.4 MG Caps capsule Commonly known as: Flomax Take 1 capsule (0.4 mg total) by mouth daily.        Allergies: No Known Allergies  Family History  Problem Relation Age of Onset   Cancer Father     Social History:  reports that [name] has never smoked. [name] has never used smokeless tobacco. [name] reports that [name] does not drink alcohol and does not use drugs.  ROS: A complete review of systems was performed.  All systems are negative except for pertinent findings as noted.  Physical Exam:  Vital signs in last 24 hours: There were no vitals taken for this visit. Constitutional:  Alert and oriented, No acute distress Cardiovascular: Regular rate  Respiratory: Normal respiratory effort GI: Abdomen is soft, nontender, nondistended, no abdominal masses. No CVAT.  Genitourinary: Normal male phallus,  testes are descended bilaterally and non-tender and without masses, scrotum is normal in appearance without lesions or masses, perineum is normal on inspection.  Prostate greater than 100 mL, symmetrical, nonnodular, nontender Lymphatic: No lymphadenopathy Neurologic: Grossly intact, no focal deficits Psychiatric: Normal mood and affect  I have reviewed prior pt notes  I have reviewed notes from referring/previous physicians  I have reviewed urinalysis results  I have independently reviewed prior imaging  Pathology was reviewed  Bladder scan reviewed   Impression/Assessment:  Mildly elevated PSA with negative biopsy a year ago at which time his prostate was found to be 170 mL  BPH, symptoms minimal on tamsulosin  Plan:  1.  I reassured him that [name] has a fairly low chance of having prostate cancer with his low PSA density  2.  His PSA will be checked today  3.  I will see him on an annual basis

## 2021-02-10 NOTE — Progress Notes (Signed)
Urological Symptom Review  Patient is experiencing the following symptoms: Frequent urination Get up at night to urinate Leakage of urine Trouble starting stream   Review of Systems  Gastrointestinal (upper)  : Negative for upper GI symptoms  Gastrointestinal (lower) : Negative for lower GI symptoms  Constitutional : Negative for symptoms  Skin: Negative for skin symptoms  Eyes: Negative for eye symptoms  Ear/Nose/Throat : Negative for Ear/Nose/Throat symptoms  Hematologic/Lymphatic: Negative for Hematologic/Lymphatic symptoms  Cardiovascular : Negative for cardiovascular symptoms  Respiratory : Negative for respiratory symptoms  Endocrine: Negative for endocrine symptoms  Musculoskeletal: Negative for musculoskeletal symptoms  Neurological: Negative for neurological symptoms  Psychologic: Negative for psychiatric symptoms

## 2021-02-11 LAB — PSA: Prostate Specific Ag, Serum: 5.9 ng/mL — ABNORMAL HIGH (ref 0.0–4.0)

## 2021-02-12 NOTE — Progress Notes (Signed)
Sent via mychart

## 2021-03-06 DIAGNOSIS — H6123 Impacted cerumen, bilateral: Secondary | ICD-10-CM | POA: Diagnosis not present

## 2021-05-14 DIAGNOSIS — R972 Elevated prostate specific antigen [PSA]: Secondary | ICD-10-CM | POA: Diagnosis not present

## 2021-05-14 DIAGNOSIS — E782 Mixed hyperlipidemia: Secondary | ICD-10-CM | POA: Diagnosis not present

## 2021-05-21 ENCOUNTER — Other Ambulatory Visit: Payer: Self-pay

## 2021-05-21 DIAGNOSIS — E782 Mixed hyperlipidemia: Secondary | ICD-10-CM | POA: Diagnosis not present

## 2021-05-21 DIAGNOSIS — R32 Unspecified urinary incontinence: Secondary | ICD-10-CM | POA: Diagnosis not present

## 2021-05-21 DIAGNOSIS — E669 Obesity, unspecified: Secondary | ICD-10-CM | POA: Diagnosis not present

## 2021-05-21 DIAGNOSIS — R978 Other abnormal tumor markers: Secondary | ICD-10-CM | POA: Diagnosis not present

## 2021-06-22 ENCOUNTER — Encounter (INDEPENDENT_AMBULATORY_CARE_PROVIDER_SITE_OTHER): Payer: Self-pay | Admitting: *Deleted

## 2021-07-07 ENCOUNTER — Telehealth (INDEPENDENT_AMBULATORY_CARE_PROVIDER_SITE_OTHER): Payer: Self-pay | Admitting: *Deleted

## 2021-07-07 ENCOUNTER — Encounter (INDEPENDENT_AMBULATORY_CARE_PROVIDER_SITE_OTHER): Payer: Self-pay | Admitting: *Deleted

## 2021-07-07 MED ORDER — PEG 3350-KCL-NA BICARB-NACL 420 G PO SOLR: 4000.0000 mL | Freq: Once | ORAL | 0 refills | Status: AC

## 2021-07-07 NOTE — Telephone Encounter (Signed)
Patient needs trilyte 

## 2021-07-08 ENCOUNTER — Other Ambulatory Visit (INDEPENDENT_AMBULATORY_CARE_PROVIDER_SITE_OTHER): Payer: Self-pay

## 2021-07-08 DIAGNOSIS — Z86010 Personal history of colonic polyps: Secondary | ICD-10-CM

## 2021-07-08 DIAGNOSIS — Z800 Family history of malignant neoplasm of digestive organs: Secondary | ICD-10-CM

## 2021-07-30 ENCOUNTER — Telehealth (INDEPENDENT_AMBULATORY_CARE_PROVIDER_SITE_OTHER): Payer: Self-pay | Admitting: *Deleted

## 2021-07-30 NOTE — Telephone Encounter (Signed)
Referring MD/PCP: hall ? ?Procedure: tcs ? ?Reason/Indication:  hx polyps, fam hx colon ca ? ?Has patient had this procedure before?  Yes, 06/2016 ? If so, when, by whom and where?   ? ?Is there a family history of colon cancer?  Yes, father ? Who?  What age when diagnosed?   ? ?Is patient diabetic? If yes, Type 1 or Type 2   no ?     ?Does patient have prosthetic heart valve or mechanical valve?  no ? ?Do you have a pacemaker/defibrillator?  no ? ?Has patient ever had endocarditis/atrial fibrillation? no ? ?Does patient use oxygen? no ? ?Has patient had joint replacement within last 12 months?  no ? ?Is patient constipated or do they take laxatives? no ? ?Does patient have a history of alcohol/drug use?  no ? ?Have you had a stroke/heart attack last 6 mths? no ? ?Do you take medicine for weight loss?  no ? ?For male patients,: have you had a hysterectomy  ?                     are you post menopausal  ?                     do you still have your menstrual cycle  ? ?Is patient on blood thinner such as Coumadin, Plavix and/or Aspirin? no ? ?Medications: none ? ?Allergies: nkda ? ?Medication Adjustment per Dr [name]/Dr Jenetta Downer  ? ?Procedure date & time: 08/27/21 ? ? ?

## 2021-08-11 DIAGNOSIS — H6123 Impacted cerumen, bilateral: Secondary | ICD-10-CM | POA: Diagnosis not present

## 2021-08-27 ENCOUNTER — Ambulatory Visit (HOSPITAL_COMMUNITY): Payer: Medicare Other | Admitting: Anesthesiology

## 2021-08-27 ENCOUNTER — Ambulatory Visit (HOSPITAL_BASED_OUTPATIENT_CLINIC_OR_DEPARTMENT_OTHER): Payer: Medicare Other | Admitting: Anesthesiology

## 2021-08-27 ENCOUNTER — Other Ambulatory Visit: Payer: Self-pay

## 2021-08-27 ENCOUNTER — Ambulatory Visit (HOSPITAL_COMMUNITY)
Admission: RE | Admit: 2021-08-27 | Discharge: 2021-08-27 | Disposition: A | Discharge status: Home / Self Care | Payer: Medicare Other | Attending: Internal Medicine | Admitting: Internal Medicine

## 2021-08-27 ENCOUNTER — Encounter (HOSPITAL_COMMUNITY): Payer: Self-pay | Admitting: Internal Medicine

## 2021-08-27 ENCOUNTER — Encounter (HOSPITAL_COMMUNITY)
Admission: RE | Disposition: A | Discharge status: Home / Self Care | Payer: Self-pay | Source: Home / Self Care | Attending: Internal Medicine

## 2021-08-27 DIAGNOSIS — Z1211 Encounter for screening for malignant neoplasm of colon: Secondary | ICD-10-CM | POA: Diagnosis not present

## 2021-08-27 DIAGNOSIS — Z86010 Personal history of colonic polyps: Secondary | ICD-10-CM

## 2021-08-27 DIAGNOSIS — K648 Other hemorrhoids: Secondary | ICD-10-CM

## 2021-08-27 DIAGNOSIS — Z800 Family history of malignant neoplasm of digestive organs: Secondary | ICD-10-CM | POA: Insufficient documentation

## 2021-08-27 DIAGNOSIS — K644 Residual hemorrhoidal skin tags: Secondary | ICD-10-CM

## 2021-08-27 DIAGNOSIS — Z09 Encounter for follow-up examination after completed treatment for conditions other than malignant neoplasm: Secondary | ICD-10-CM

## 2021-08-27 HISTORY — PX: COLONOSCOPY WITH PROPOFOL: SHX5780

## 2021-08-27 LAB — HM COLONOSCOPY

## 2021-08-27 SURGERY — COLONOSCOPY WITH PROPOFOL
Anesthesia: General

## 2021-08-27 MED ORDER — LACTATED RINGERS IV SOLN: INTRAVENOUS | Status: DC

## 2021-08-27 MED ORDER — LIDOCAINE HCL (CARDIAC) PF 100 MG/5ML IV SOSY
PREFILLED_SYRINGE | INTRAVENOUS | Status: DC | PRN
  Administered 2021-08-27: 50 mg via INTRAVENOUS

## 2021-08-27 MED ORDER — PROPOFOL 10 MG/ML IV BOLUS
INTRAVENOUS | Status: DC | PRN
  Administered 2021-08-27: 100 mg via INTRAVENOUS

## 2021-08-27 MED ORDER — PROPOFOL 1000 MG/100ML IV EMUL
INTRAVENOUS | Status: AC
  Filled 2021-08-27: qty 100

## 2021-08-27 MED ORDER — LIDOCAINE HCL (PF) 2 % IJ SOLN
INTRAMUSCULAR | Status: AC
  Filled 2021-08-27: qty 5

## 2021-08-27 MED ORDER — PROPOFOL 500 MG/50ML IV EMUL
INTRAVENOUS | Status: DC | PRN
  Administered 2021-08-27: 150 ug/kg/min via INTRAVENOUS

## 2021-08-27 NOTE — Discharge Instructions (Signed)
Resume usual medications and diet as before. No driving for 24 hours. Next colonoscopy in 5 years. 

## 2021-08-27 NOTE — H&P (Signed)
[name] is an 66 y.o. male.   ?Chief Complaint: Patient is here for colonoscopy. ?HPI: Patient is 66 year old Caucasian male who has a history of colonic adenomas and is here for surveillance examination.  He denies abdominal pain change in bowel habits or rectal bleeding. ?He had 4 small polyps removed 10 years ago and these were tubular adenoma.  Last colonoscopy in February 2018 revealed 2 polyps and only 1 was tubular adenoma. ?Family history significant for colon carcinoma in his father who was 56 at the time of diagnosis and died of multiple myeloma at age 74. ? ?Past medical history ? ?History of colonic polyps. ? ?Past Surgical History:  ?Procedure Laterality Date  ? BACK SURGERY    ? COLONOSCOPY  06/23/2011  ? Procedure: COLONOSCOPY;  Surgeon: Rogene Houston, MD;  [facility];  Service: Endoscopy;  Laterality: N/A;  3:00  ? COLONOSCOPY N/A 07/15/2016  ? Procedure: COLONOSCOPY;  Surgeon: Rogene Houston, MD;  Location: [facility];  Service: Endoscopy;  Laterality: N/A;  830  ? ? ?Family History  ?Problem Relation Age of Onset  ? Cancer Father   ? ?Social History:  reports that he has never smoked. He has never used smokeless tobacco. He reports that he does not drink alcohol and does not use drugs. ? ?Allergies: No Known Allergies ? ?Medications Prior to Admission  ?Medication Sig Dispense Refill  ? Multiple Vitamin (MULTIVITAMIN) tablet Take 1 tablet by mouth daily.     ? ? ?No results found for this or any previous visit (from the past 48 hour(s)). ?No results found. ? ?Review of Systems ? ?Blood pressure (!) 145/86, temperature 98.4 ?F (36.9 ?C), temperature source Oral, resp. rate 14, height $RemoveBe'6\' 3"'AFAFysIam$  (1.905 m), weight 102.1 kg, SpO2 98 %. ?Physical Exam ?HENT:  ?   Mouth/Throat:  ?   Mouth: Mucous membranes are moist.  ?   Pharynx: Oropharynx is clear.  ?Eyes:  ?   General: No scleral icterus. ?   Conjunctiva/sclera: Conjunctivae normal.  ?Cardiovascular:  ?   Rate and Rhythm: Normal  rate and regular rhythm.  ?   Heart sounds: Normal heart sounds. No murmur heard. ?Pulmonary:  ?   Effort: Pulmonary effort is normal.  ?   Breath sounds: Normal breath sounds.  ?Abdominal:  ?   General: There is no distension.  ?   Palpations: Abdomen is soft. There is no mass.  ?   Tenderness: There is no abdominal tenderness.  ?Musculoskeletal:     ?   General: No swelling.  ?   Cervical back: Neck supple.  ?Lymphadenopathy:  ?   Cervical: No cervical adenopathy.  ?Skin: ?   General: Skin is warm and dry.  ?Neurological:  ?   Mental Status: He is alert.  ?  ? ?Assessment/Plan ? ?History of colonic adenomas ?Family history of colon carcinoma in first-degree relative. ?Surveillance colonoscopy. ? ?Hildred Laser, MD ?08/27/2021, 7:26 AM ? ? ? ?

## 2021-08-27 NOTE — Addendum Note (Signed)
Addendum  created 08/27/21 0920 by Jonna Munro, CRNA  ? Flowsheet accepted  ?  ?

## 2021-08-27 NOTE — Anesthesia Preprocedure Evaluation (Addendum)
Anesthesia Evaluation  ?Patient identified by MRN, date of birth, ID band ?Patient awake ? ? ? ?Reviewed: ?Allergy & Precautions, NPO status , Patient's Chart, lab work & pertinent test results ? ?Airway ? ? ? ? ? ? ? Dental ? ?(+) Dental Advisory Given ?  ?Pulmonary ?neg pulmonary ROS,  ?  ?Pulmonary exam normal ?breath sounds clear to auscultation ? ? ? ? ? ? Cardiovascular ?negative cardio ROS ?Normal cardiovascular exam ?Rhythm:Regular Rate:Normal ? ? ?  ?Neuro/Psych ?negative neurological ROS ? negative psych ROS  ? GI/Hepatic ?negative GI ROS, Neg liver ROS,   ?Endo/Other  ?negative endocrine ROS ? Renal/GU ?negative Renal ROS  ?negative genitourinary ?  ?Musculoskeletal ?negative musculoskeletal ROS ?(+)  ? Abdominal ?  ?Peds ?negative pediatric ROS ?(+)  Hematology ?negative hematology ROS ?(+)   ?Anesthesia Other Findings ? ? Reproductive/Obstetrics ?negative OB ROS ? ?  ? ? ? ? ? ? ? ? ? ? ? ? ? ?  ?  ? ? ? ? ? ? ? ? ?Anesthesia Physical ?Anesthesia Plan ? ?ASA: 1 ? ?Anesthesia Plan: General  ? ?Post-op Pain Management: Minimal or no pain anticipated  ? ?Induction: Intravenous ? ?PONV Risk Score and Plan: Propofol infusion ? ?Airway Management Planned: Nasal Cannula and Natural Airway ? ?Additional Equipment:  ? ?Intra-op Plan:  ? ?Post-operative Plan:  ? ?Informed Consent: I have reviewed the patients History and Physical, chart, labs and discussed the procedure including the risks, benefits and alternatives for the proposed anesthesia with the patient or authorized representative who has indicated his/her understanding and acceptance.  ? ? ? ?Dental advisory given ? ?Plan Discussed with: CRNA and Surgeon ? ?Anesthesia Plan Comments:   ? ? ? ? ? ? ?Anesthesia Quick Evaluation ? ?

## 2021-08-27 NOTE — Op Note (Signed)
[facility] ?Patient Name: [name] ?Procedure Date: 08/27/2021 7:17 AM ?MRN: [id number] ?Date of Birth: 11-23-1955 ?Attending MD: Hildred Laser , MD ?CSN: [id number] ?Age: 66 ?Admit Type: Outpatient ?Procedure:                Colonoscopy ?Indications:              High risk colon cancer surveillance: Personal  ?                          history of colonic polyps ?Providers:                Hildred Laser, MD, Lambert Mody, [place]                          Risa Grill, Technician ?Referring MD:             Delphina Cahill, MD ?Medicines:                Propofol per Anesthesia ?Complications:            No immediate complications. ?Estimated Blood Loss:     Estimated blood loss: none. ?Procedure:                Pre-Anesthesia Assessment: ?                          - Prior to the procedure, a History and Physical  ?                          was performed, and patient medications and  ?                          allergies were reviewed. The patient's tolerance of  ?                          previous anesthesia was also reviewed. The risks  ?                          and benefits of the procedure and the sedation  ?                          options and risks were discussed with the patient.  ?                          All questions were answered, and informed consent  ?                          was obtained. Prior Anticoagulants: The patient has  ?                          taken no previous anticoagulant or antiplatelet  ?                          agents. ASA Grade Assessment: I - A normal, healthy  ?  patient. After reviewing the risks and benefits,  ?                          the patient was deemed in satisfactory condition to  ?                          undergo the procedure. ?                          After obtaining informed consent, the colonoscope  ?                          was passed under direct vision. Throughout the  ?                          procedure, the patient's blood  pressure, pulse, and  ?                          oxygen saturations were monitored continuously. The  ?                          PCF-HQ190L ([id number]) scope was introduced through  ?                          the anus and advanced to the the cecum, identified  ?                          by appendiceal orifice and ileocecal valve. The  ?                          colonoscopy was somewhat difficult due to a  ?                          redundant colon. Successful completion of the  ?                          procedure was aided by using manual pressure and  ?                          withdrawing and reinserting the scope. The patient  ?                          tolerated the procedure well. The quality of the  ?                          bowel preparation was good after vigorous washing  ?                          and using Mylicon to get her to of air bubbles..  ?                          The ileocecal valve, appendiceal orifice, and  ?  rectum were photographed. ?Scope In: 7:38:54 AM ?Scope Out: 8:04:38 AM ?Scope Withdrawal Time: 0 hours 13 minutes 55 seconds  ?Total Procedure Duration: 0 hours 25 minutes 44 seconds  ?Findings: ?     The perianal and digital rectal examinations were normal. ?     The colon (entire examined portion) appeared normal. ?     External and internal hemorrhoids were found during retroflexion. The  ?     hemorrhoids were small. ?Impression:               - The entire examined colon is normal. ?                          - External and internal hemorrhoids. ?                          - No specimens collected. ?Moderate Sedation: ?     Per Anesthesia Care ?Recommendation:           - Patient has a contact number available for  ?                          emergencies. The signs and symptoms of potential  ?                          delayed complications were discussed with the  ?                          patient. Return to normal activities tomorrow.  ?                           Written discharge instructions were provided to the  ?                          patient. ?                          - Resume previous diet today. ?                          - Continue present medications. ?                          - Repeat colonoscopy in 5 years for surveillance. ?Procedure Code(s):        --- Professional --- ?                          [phone], Colonoscopy, flexible; diagnostic, including  ?                          collection of specimen(s) by brushing or washing,  ?                          when performed (separate procedure) ?Diagnosis Code(s):        --- Professional --- ?                          Z86.010, Personal history of  colonic polyps ?                          K64.8, Other hemorrhoids ?CPT copyright 2019 [employer]. All rights reserved. ?The codes documented in this report are preliminary and upon coder review may  ?be revised to meet current compliance requirements. ?Hildred Laser, MD ?Hildred Laser, MD ?08/27/2021 8:12:30 AM ?This report has been signed electronically. ?Number of Addenda: 0 ?

## 2021-08-27 NOTE — Anesthesia Postprocedure Evaluation (Signed)
Anesthesia Post Note ? ?Patient: TAEVION SIKORA ? ?Procedure(s) Performed: COLONOSCOPY WITH PROPOFOL ? ?Patient location during evaluation: Endoscopy ?Anesthesia Type: General ?Level of consciousness: awake and alert and oriented ?Pain management: pain level controlled ?Vital Signs Assessment: post-procedure vital signs reviewed and stable ?Respiratory status: spontaneous breathing, nonlabored ventilation and respiratory function stable ?Cardiovascular status: blood pressure returned to baseline and stable ?Postop Assessment: no apparent nausea or vomiting ?Anesthetic complications: no ? ? ?No notable events documented. ? ? ?Last Vitals:  ?Vitals:  ? 08/27/21 0643 08/27/21 0809  ?BP: (!) 145/86 111/63  ?Pulse:  75  ?Resp: 14 16  ?Temp: 36.9 ?C (!) 36.4 ?C  ?SpO2: 98% 95%  ?  ?Last Pain:  ?Vitals:  ? 08/27/21 0809  ?TempSrc: Oral  ?PainSc: 0-No pain  ? ? ?  ?  ?  ?  ?  ?  ? ?Shiquan Mathieu C Anahi Belmar ? ? ? ? ?

## 2021-08-27 NOTE — Transfer of Care (Signed)
Immediate Anesthesia Transfer of Care Note ? ?Patient: [name] ? ?Procedure(s) Performed: COLONOSCOPY WITH PROPOFOL ? ?Patient Location: PACU ? ?Anesthesia Type:General ? ?Level of Consciousness: awake, alert , oriented and patient cooperative ? ?Airway & Oxygen Therapy: Patient Spontanous Breathing ? ?Post-op Assessment: Report given to RN, Post -op Vital signs reviewed and stable and Patient moving all extremities ? ?Post vital signs: Reviewed and stable ? ?Last Vitals:  ?Vitals Value Taken Time  ?BP    ?Temp    ?Pulse    ?Resp    ?SpO2    ? ? ?Last Pain:  ?Vitals:  ? 08/27/21 0733  ?TempSrc:   ?PainSc: 0-No pain  ?   ? ?Patients Stated Pain Goal: 4 (08/27/21 6190) ? ?Complications: No notable events documented. ?

## 2021-08-27 NOTE — Addendum Note (Signed)
Addendum  created 08/27/21 0840 by Jonna Munro, CRNA  ? Flowsheet accepted  ?  ?

## 2021-08-31 ENCOUNTER — Encounter (INDEPENDENT_AMBULATORY_CARE_PROVIDER_SITE_OTHER): Payer: Self-pay | Admitting: *Deleted

## 2021-09-02 ENCOUNTER — Encounter (HOSPITAL_COMMUNITY): Payer: Self-pay | Admitting: Internal Medicine

## 2021-12-16 DIAGNOSIS — H6123 Impacted cerumen, bilateral: Secondary | ICD-10-CM | POA: Diagnosis not present

## 2022-01-26 ENCOUNTER — Ambulatory Visit: Payer: Medicare Other | Admitting: Urology

## 2022-01-26 VITALS — BP 141/91 | HR 88

## 2022-01-26 DIAGNOSIS — N5201 Erectile dysfunction due to arterial insufficiency: Secondary | ICD-10-CM | POA: Diagnosis not present

## 2022-01-26 DIAGNOSIS — R972 Elevated prostate specific antigen [PSA]: Secondary | ICD-10-CM | POA: Diagnosis not present

## 2022-01-26 DIAGNOSIS — N400 Enlarged prostate without lower urinary tract symptoms: Secondary | ICD-10-CM

## 2022-01-26 LAB — MICROSCOPIC EXAMINATION
Bacteria, UA: NONE SEEN
Epithelial Cells (non renal): NONE SEEN /hpf (ref 0–10)
Renal Epithel, UA: NONE SEEN /hpf
WBC, UA: NONE SEEN /hpf (ref 0–5)

## 2022-01-26 LAB — URINALYSIS, ROUTINE W REFLEX MICROSCOPIC
Bilirubin, UA: NEGATIVE
Glucose, UA: NEGATIVE
Ketones, UA: NEGATIVE
Leukocytes,UA: NEGATIVE
Nitrite, UA: NEGATIVE
Protein,UA: NEGATIVE
Specific Gravity, UA: 1.025 (ref 1.005–1.030)
Urobilinogen, Ur: 0.2 mg/dL (ref 0.2–1.0)
pH, UA: 5 (ref 5.0–7.5)

## 2022-01-26 MED ORDER — SILDENAFIL CITRATE 100 MG PO TABS: ORAL_TABLET | ORAL | 99 refills | Status: DC

## 2022-01-26 NOTE — Progress Notes (Signed)
History of Present Illness: He follows up for elevated PSA.  Because of a PSA of 5.5 in September 2021 he underwent ultrasound and biopsy of his prostate in [facility] by Dr. Erlene Quan.  Prostate was found to be 170 mL.  PSA density 0.03.  All 12 cores were negative.  9.5.2023: PSA a year ago 5.9.  Overall, he voids adequately.  He did have an issue last night with waking up 4 times in the slow stream with hesitancy.  That is unusual, however. IPSS 7, quality-of-life score 3.  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  No other urologic issues over the past year.  No past medical history on file.  Past Surgical History:  Procedure Laterality Date   BACK SURGERY     COLONOSCOPY  06/23/2011   Procedure: COLONOSCOPY;  Surgeon: Rogene Houston, MD;  [facility]: [facility];  Service: Endoscopy;  Laterality: N/A;  3:00   COLONOSCOPY N/A 07/15/2016   Procedure: COLONOSCOPY;  Surgeon: Rogene Houston, MD;  [facility]: [facility];  Service: Endoscopy;  Laterality: N/A;  830   COLONOSCOPY WITH PROPOFOL N/A 08/27/2021   Procedure: COLONOSCOPY WITH PROPOFOL;  Surgeon: Rogene Houston, MD;  [facility]: [facility];  Service: Endoscopy;  Laterality: N/A;  730    Home Medications:  Allergies as of 01/26/2022   No Known Allergies      Medication List        Accurate as of January 26, 2022 11:43 AM. If you have any questions, ask your nurse or doctor.          multivitamin tablet Take 1 tablet by mouth daily.        Allergies: No Known Allergies  Family History  Problem Relation Age of Onset   Cancer Father     Social History:  reports that he has never smoked. He has never used smokeless tobacco. He reports that he does not drink alcohol and does not use drugs.  ROS: A complete review of systems was performed.  All systems are negative except for pertinent findings as noted.  Physical Exam:  Vital signs in last 24 hours: There were no vitals taken for this visit. Constitutional:   Alert and oriented, No acute distress Cardiovascular: Regular rate  Respiratory: Normal respiratory effort GI: Abdomen is soft, nontender, nondistended, no abdominal masses. No CVAT.  No inguinal hernias Genitourinary: Normal male phallus, testes are descended bilaterally and non-tender and without masses, scrotum is normal in appearance without lesions or masses, perineum is normal on inspection.  Prostate greater than 100 g, symmetric, nonnodular, nontender Lymphatic: No lymphadenopathy Neurologic: Grossly intact, no focal deficits Psychiatric: Normal mood and affect  I have reviewed prior pt notes  I have reviewed urinalysis results  I have independently reviewed prior imaging--prostate U/S volume  I have reviewed prior PSA/pathology results  Bladder scan volume today 50 mL    Impression/Assessment:  1.  BPH with very large gland with, except for last night, adequate urinary symptoms  2.  History of elevated PSA with negative biopsy 2 years ago  3.  ED, mild  Plan:  1.  I offered him PDE 5 inhibitors.  He would like a trial.  I wrote him for sildenafil  2.  PSA is checked today  3.  If urinary symptoms continue to be of bother, he will let me know+ and we will consider alpha-blocker  4.  I will see back in 1 yea5r

## 2022-01-28 ENCOUNTER — Encounter: Payer: Self-pay | Admitting: Urology

## 2022-01-28 LAB — PSA: Prostate Specific Ag, Serum: 7 ng/mL — ABNORMAL HIGH (ref 0.0–4.0)

## 2022-02-09 ENCOUNTER — Ambulatory Visit: Payer: PRIVATE HEALTH INSURANCE | Admitting: Urology

## 2022-04-07 ENCOUNTER — Ambulatory Visit
Admission: RE | Admit: 2022-04-07 | Discharge: 2022-04-07 | Disposition: A | Discharge status: Home / Self Care | Payer: Medicare Other | Source: Ambulatory Visit | Attending: Nurse Practitioner | Admitting: Nurse Practitioner

## 2022-04-07 VITALS — BP 109/76 | HR 100 | Temp 97.5°F | Resp 20

## 2022-04-07 DIAGNOSIS — Z1152 Encounter for screening for COVID-19: Secondary | ICD-10-CM | POA: Diagnosis not present

## 2022-04-07 DIAGNOSIS — B349 Viral infection, unspecified: Secondary | ICD-10-CM

## 2022-04-07 DIAGNOSIS — Z20822 Contact with and (suspected) exposure to covid-19: Secondary | ICD-10-CM | POA: Insufficient documentation

## 2022-04-07 LAB — RESP PANEL BY RT-PCR (FLU A&B, COVID) ARPGX2
Influenza A by PCR: NEGATIVE
Influenza B by PCR: NEGATIVE
SARS Coronavirus 2 by RT PCR: POSITIVE — AB

## 2022-04-07 MED ORDER — PROMETHAZINE-DM 6.25-15 MG/5ML PO SYRP: 5.0000 mL | ORAL_SOLUTION | Freq: Four times a day (QID) | ORAL | 0 refills | Status: DC | PRN

## 2022-04-07 NOTE — Discharge Instructions (Addendum)
COVID/flu test is pending.  We will be contacted if your pending test results are positive.  As discussed, you are a candidate to receive molnupiravir after review of your chart and lab work. Increase fluids and allow for plenty of rest. Recommend remaining isolated until you have received your pending test results. Recommend using a humidifier in your bedroom at night to help with cough.  Also recommend sleeping elevated on pillows while cough symptoms persist. May take over-the-counter Tylenol as needed for pain, fever, or general discomfort. Go to the emergency department if you experience worsening shortness of breath, difficulty breathing, or other concerns. Follow-up as needed.

## 2022-04-07 NOTE — ED Triage Notes (Signed)
Pt reports cough and body aches x 1 day.Reports wife has COVID.

## 2022-04-07 NOTE — ED Provider Notes (Signed)
[facility]    CSN: [id number] Arrival date & time: 04/07/22  0955      History   Chief Complaint Chief Complaint  Patient presents with   Appointment    1000   Covid Exposure    HPI [name] is a 66 y.o. male.   The history is provided by the patient.   Patient presents after a COVID exposure.  He complains of cough and body aches that been present for the past day.  Patient states that his wife tested positive for COVID 2 days ago.  He reports subjective fever.  He denies wheezing, shortness of breath, difficulty breathing, abdominal pain, nausea, vomiting, or diarrhea.  He states that he has not taken any medication for his symptoms.  Reports he has received both the influenza and COVID vaccines.  History reviewed. No pertinent past medical history.  Patient Active Problem List   Diagnosis Date Noted   Family hx of colon cancer 06/11/2016   Hx of adenomatous colonic polyps 06/11/2016    Past Surgical History:  Procedure Laterality Date   BACK SURGERY     COLONOSCOPY  06/23/2011   Procedure: COLONOSCOPY;  Surgeon: Rogene Houston, MD;  [facility]: [facility];  Service: Endoscopy;  Laterality: N/A;  3:00   COLONOSCOPY N/A 07/15/2016   Procedure: COLONOSCOPY;  Surgeon: Rogene Houston, MD;  Location: [facility];  Service: Endoscopy;  Laterality: N/A;  830   COLONOSCOPY WITH PROPOFOL N/A 08/27/2021   Procedure: COLONOSCOPY WITH PROPOFOL;  Surgeon: Rogene Houston, MD;  Location: [facility];  Service: Endoscopy;  Laterality: N/A;  730       Home Medications    Prior to Admission medications   Medication Sig Start Date End Date Taking? Authorizing Provider  promethazine-dextromethorphan (PROMETHAZINE-DM) 6.25-15 MG/5ML syrup Take 5 mLs by mouth 4 (four) times daily as needed for cough. 04/07/22  Yes Tashunda Vandezande-Warren, Alda Lea, NP  Multiple Vitamin (MULTIVITAMIN) tablet Take 1 tablet by mouth daily.     [provider]  sildenafil  (VIAGRA) 100 MG tablet 1/2 to 1 tab po prn 01/26/22   Franchot Gallo, MD    Family History Family History  Problem Relation Age of Onset   Cancer Father     Social History Social History   Tobacco Use   Smoking status: Never   Smokeless tobacco: Never  Vaping Use   Vaping Use: Never used  Substance Use Topics   Alcohol use: No   Drug use: No     Allergies   Patient has no known allergies.   Review of Systems Review of Systems Per HPI  Physical Exam Triage Vital Signs ED Triage Vitals  Enc Vitals Group     BP 04/07/22 1003 109/76     Pulse Rate 04/07/22 1003 100     Resp 04/07/22 1003 20     Temp 04/07/22 1003 (!) 97.5 F (36.4 C)     Temp Source 04/07/22 1003 Temporal     SpO2 04/07/22 1003 100 %     Weight --      Height --      Head Circumference --      Peak Flow --      Pain Score 04/07/22 1006 0     Pain Loc --      Pain Edu? --      Excl. in [place]? --    No data found.  Updated Vital Signs BP 109/76 (BP Location: Right  Arm)   Pulse 100   Temp (!) 97.5 F (36.4 C) (Temporal)   Resp 20   SpO2 100%   Visual Acuity Right Eye Distance:   Left Eye Distance:   Bilateral Distance:    Right Eye Near:   Left Eye Near:    Bilateral Near:     Physical Exam Vitals and nursing note reviewed.  Constitutional:      General: He is not in acute distress.    Appearance: Normal appearance.  HENT:     Head: Normocephalic.     Right Ear: Tympanic membrane, ear canal and external ear normal.     Left Ear: Tympanic membrane, ear canal and external ear normal.     Nose: Nose normal.     Mouth/Throat:     Mouth: Mucous membranes are moist.     Pharynx: Posterior oropharyngeal erythema present.  Eyes:     Extraocular Movements: Extraocular movements intact.     Conjunctiva/sclera: Conjunctivae normal.     Pupils: Pupils are equal, round, and reactive to light.  Cardiovascular:     Rate and Rhythm: Normal rate and regular rhythm.     Pulses: Normal  pulses.     Heart sounds: Normal heart sounds.  Pulmonary:     Effort: Pulmonary effort is normal. No respiratory distress.     Breath sounds: Normal breath sounds. No stridor. No wheezing, rhonchi or rales.  Abdominal:     General: Bowel sounds are normal.     Palpations: Abdomen is soft.     Tenderness: There is no abdominal tenderness.  Musculoskeletal:     Cervical back: Normal range of motion.  Lymphadenopathy:     Cervical: No cervical adenopathy.  Skin:    General: Skin is warm and dry.  Neurological:     General: No focal deficit present.     Mental Status: He is alert and oriented to person, place, and time.  Psychiatric:        Mood and Affect: Mood normal.      UC Treatments / Results  Labs (all labs ordered are listed, but only abnormal results are displayed) Labs Reviewed  RESP PANEL BY RT-PCR (FLU A&B, COVID) [id number]    EKG   Radiology No results found.  Procedures Procedures (including critical care time)  Medications Ordered in UC Medications - No data to display  Initial Impression / Assessment and Plan / UC Course  I have reviewed the triage vital signs and the nursing notes.  Pertinent labs & imaging results that were available during my care of the patient were reviewed by me and considered in my medical decision making (see chart for details).  Patient presents with a 1 day history of cough and body aches.  Patient reports close exposure to COVID by his wife.  Patient's vital signs are stable, he is in no acute distress.  COVID/flu test is pending.  Will await results determine if antiviral therapy is needed.  If so, patient is a candidate to receive molnupiravir.  Patient was prescribed Promethazine DM for his cough in the interim.  Supportive care recommendations were provided to the patient along with strict signs.  Patient verbalizes understanding.  All questions were answered.  Patient is stable for discharge. Final Clinical Impressions(s) /  UC Diagnoses   Final diagnoses:  Encounter for screening for COVID-19  Viral illness  Close exposure to COVID-19 virus     Discharge Instructions      COVID/flu test is pending.  We will be contacted if your pending test results are positive.  As discussed, you are a candidate to receive molnupiravir after review of your chart and lab work. Increase fluids and allow for plenty of rest. Recommend remaining isolated until you have received your pending test results. Recommend using a humidifier in your bedroom at night to help with cough.  Also recommend sleeping elevated on pillows while cough symptoms persist. May take over-the-counter Tylenol as needed for pain, fever, or general discomfort. Go to the emergency department if you experience worsening shortness of breath, difficulty breathing, or other concerns. Follow-up as needed.     ED Prescriptions     Medication Sig Dispense Auth. Provider   promethazine-dextromethorphan (PROMETHAZINE-DM) 6.25-15 MG/5ML syrup Take 5 mLs by mouth 4 (four) times daily as needed for cough. 140 mL Neil Brickell-Warren, Alda Lea, NP      PDMP not reviewed this encounter.   Tish Men, NP 04/07/22 1039

## 2022-04-08 ENCOUNTER — Telehealth (HOSPITAL_COMMUNITY): Payer: Self-pay | Admitting: Emergency Medicine

## 2022-04-08 MED ORDER — MOLNUPIRAVIR EUA 200MG CAPSULE: 4.0000 | ORAL_CAPSULE | Freq: Two times a day (BID) | ORAL | 0 refills | Status: AC

## 2022-05-18 DIAGNOSIS — H6123 Impacted cerumen, bilateral: Secondary | ICD-10-CM | POA: Diagnosis not present

## 2022-06-11 DIAGNOSIS — M79673 Pain in unspecified foot: Secondary | ICD-10-CM | POA: Diagnosis not present

## 2022-06-11 DIAGNOSIS — Z0000 Encounter for general adult medical examination without abnormal findings: Secondary | ICD-10-CM | POA: Diagnosis not present

## 2022-06-11 DIAGNOSIS — Z13220 Encounter for screening for lipoid disorders: Secondary | ICD-10-CM | POA: Diagnosis not present

## 2022-06-11 DIAGNOSIS — Z1159 Encounter for screening for other viral diseases: Secondary | ICD-10-CM | POA: Diagnosis not present

## 2022-06-11 DIAGNOSIS — Z125 Encounter for screening for malignant neoplasm of prostate: Secondary | ICD-10-CM | POA: Diagnosis not present

## 2022-06-11 DIAGNOSIS — Z131 Encounter for screening for diabetes mellitus: Secondary | ICD-10-CM | POA: Diagnosis not present

## 2022-06-17 ENCOUNTER — Encounter: Payer: Self-pay | Admitting: Urology

## 2022-06-24 ENCOUNTER — Telehealth: Payer: Self-pay

## 2022-06-24 NOTE — Telephone Encounter (Signed)
ERROR

## 2022-06-28 ENCOUNTER — Telehealth: Payer: Self-pay

## 2022-06-28 NOTE — Telephone Encounter (Signed)
Patient called advising at his annual physical last week his PSA labs were elevated at 8.2. He advised he is unable to come in this week due to being out of town. He wanted to know what you recommended with the increase in his PSA.

## 2022-07-01 NOTE — Telephone Encounter (Signed)
Tried calling patient with no answer. Left Vm for return call

## 2022-07-02 DIAGNOSIS — M79672 Pain in left foot: Secondary | ICD-10-CM | POA: Diagnosis not present

## 2022-07-02 DIAGNOSIS — M722 Plantar fascial fibromatosis: Secondary | ICD-10-CM | POA: Diagnosis not present

## 2022-07-02 DIAGNOSIS — M79671 Pain in right foot: Secondary | ICD-10-CM | POA: Diagnosis not present

## 2022-07-02 DIAGNOSIS — M85672 Other cyst of bone, left ankle and foot: Secondary | ICD-10-CM | POA: Diagnosis not present

## 2022-07-05 NOTE — Telephone Encounter (Signed)
Patient is aware and office visit has been scheduled along with PSA labs. Patient voiced understanding

## 2022-07-15 DIAGNOSIS — M79672 Pain in left foot: Secondary | ICD-10-CM | POA: Diagnosis not present

## 2022-07-21 DIAGNOSIS — M85672 Other cyst of bone, left ankle and foot: Secondary | ICD-10-CM | POA: Diagnosis not present

## 2022-07-21 DIAGNOSIS — M79671 Pain in right foot: Secondary | ICD-10-CM | POA: Diagnosis not present

## 2022-07-21 DIAGNOSIS — M722 Plantar fascial fibromatosis: Secondary | ICD-10-CM | POA: Diagnosis not present

## 2022-07-21 DIAGNOSIS — M79672 Pain in left foot: Secondary | ICD-10-CM | POA: Diagnosis not present

## 2022-07-23 HISTORY — PX: OTHER SURGICAL HISTORY: SHX169

## 2022-08-12 DIAGNOSIS — R918 Other nonspecific abnormal finding of lung field: Secondary | ICD-10-CM | POA: Diagnosis not present

## 2022-08-12 DIAGNOSIS — N400 Enlarged prostate without lower urinary tract symptoms: Secondary | ICD-10-CM | POA: Diagnosis not present

## 2022-08-12 DIAGNOSIS — D480 Neoplasm of uncertain behavior of bone and articular cartilage: Secondary | ICD-10-CM | POA: Diagnosis not present

## 2022-08-20 DIAGNOSIS — G8918 Other acute postprocedural pain: Secondary | ICD-10-CM | POA: Diagnosis not present

## 2022-08-20 DIAGNOSIS — M85572 Aneurysmal bone cyst, left ankle and foot: Secondary | ICD-10-CM | POA: Diagnosis not present

## 2022-08-20 DIAGNOSIS — D1632 Benign neoplasm of short bones of left lower limb: Secondary | ICD-10-CM | POA: Diagnosis not present

## 2022-08-20 DIAGNOSIS — D480 Neoplasm of uncertain behavior of bone and articular cartilage: Secondary | ICD-10-CM | POA: Diagnosis not present

## 2022-08-22 ENCOUNTER — Encounter (HOSPITAL_COMMUNITY): Payer: Self-pay | Admitting: Emergency Medicine

## 2022-08-22 ENCOUNTER — Other Ambulatory Visit: Payer: Self-pay

## 2022-08-22 ENCOUNTER — Emergency Department (HOSPITAL_COMMUNITY)
Admission: EM | Admit: 2022-08-22 | Discharge: 2022-08-22 | Disposition: A | Discharge status: Home / Self Care | Payer: Medicare Other | Attending: Emergency Medicine | Admitting: Emergency Medicine

## 2022-08-22 DIAGNOSIS — R339 Retention of urine, unspecified: Secondary | ICD-10-CM | POA: Diagnosis not present

## 2022-08-22 MED ORDER — TAMSULOSIN HCL 0.4 MG PO CAPS
0.4000 mg | ORAL_CAPSULE | Freq: Once | ORAL | Status: AC
  Administered 2022-08-22: 0.4 mg via ORAL
  Filled 2022-08-22: qty 1

## 2022-08-22 MED ORDER — TAMSULOSIN HCL 0.4 MG PO CAPS: 0.4000 mg | ORAL_CAPSULE | Freq: Every day | ORAL | 0 refills | Status: DC

## 2022-08-22 NOTE — ED Triage Notes (Signed)
Pt c/o urinary retention. Pt reports last urination was this past evening. Bladder scan shows 967cc. Of note, patient has left foot surgery at [facility] Friday and has had issues urinating post anesthesia. He follows up with urology once per year for enlarged prostate. [name]

## 2022-08-22 NOTE — ED Notes (Signed)
Leg bag placed prior to discharge. John Corona Edd Fabian

## 2022-08-22 NOTE — ED Provider Notes (Signed)
[facility] Provider Note   CSN: [id number] Arrival date & time: 08/22/22  0134     History  Chief Complaint  Patient presents with   Urinary Retention    [name] is a 67 y.o. male.  67 year old male with history of BPH, not on Flomax, sees Dr. Diona Fanti with [facility].  Comes the ED today with suprapubic pain and difficulty urinating.  Patient states that he had a foot surgery earlier Friday under general anesthesia for couple hours.  Difficulty urinating since then but then tonight he has not urinated at all and is a worsening pain.  No urinary symptoms prior to this.    Home Medications Prior to Admission medications   Medication Sig Start Date End Date Taking? Authorizing Provider  tamsulosin (FLOMAX) 0.4 MG CAPS capsule Take 1 capsule (0.4 mg total) by mouth daily. Until stone passes 08/22/22  Yes Kaleb Linquist, Corene Cornea, MD  Multiple Vitamin (MULTIVITAMIN) tablet Take 1 tablet by mouth daily.     [provider]  promethazine-dextromethorphan (PROMETHAZINE-DM) 6.25-15 MG/5ML syrup Take 5 mLs by mouth 4 (four) times daily as needed for cough. 04/07/22   Leath-Warren, Alda Lea, NP  sildenafil (VIAGRA) 100 MG tablet 1/2 to 1 tab po prn 01/26/22   Franchot Gallo, MD      Allergies    Patient has no known allergies.    Review of Systems   Review of Systems  Physical Exam Updated Vital Signs BP (!) 141/73   Pulse 84   Temp 98 F (36.7 C) (Oral)   Resp 20   Ht 6\' 2"  (1.88 m)   Wt 99.8 kg   SpO2 96%   BMI 28.25 kg/m  Physical Exam Vitals and nursing note reviewed.  Constitutional:      Appearance: He is well-developed.  HENT:     Head: Normocephalic and atraumatic.     Mouth/Throat:     Mouth: Mucous membranes are moist.  Cardiovascular:     Rate and Rhythm: Normal rate.  Pulmonary:     Effort: Pulmonary effort is normal. No respiratory distress.  Abdominal:     General: Abdomen is flat. There is  no distension.  Musculoskeletal:        General: Normal range of motion.     Cervical back: Normal range of motion.  Skin:    General: Skin is warm and dry.  Neurological:     Mental Status: He is alert.     ED Results / Procedures / Treatments   Labs (all labs ordered are listed, but only abnormal results are displayed) Labs Reviewed  URINE CULTURE    EKG Ramirez  Radiology No results found.  Procedures Procedures    Medications Ordered in ED Medications  tamsulosin (FLOMAX) capsule 0.4 mg (0.4 mg Oral Given 08/22/22 [id number])    ED Course/ Medical Decision Making/ A&P                             Medical Decision Making Amount and/or Complexity of Data Reviewed Labs: ordered.  Risk Prescription drug management.   Patient with nearly a liter in his bladder likely related to BPH after anesthesia and likely had catheterization during surgery as well.  Foley placed prior to my evaluation patient has significant improvement in his symptoms.  Will discharge with Foley in place to follow-up with urology but also started back on Flomax pending the same.  No  indication for checking kidney function since starting this been a relatively short period of time that he is actually had the retention.  Will send a culture on the urine although no signs or symptoms of infection.   Final Clinical Impression(s) / ED Diagnoses Final diagnoses:  Urinary retention    Rx / DC Orders ED Discharge Orders          Ordered    tamsulosin (FLOMAX) 0.4 MG CAPS capsule  Daily        08/22/22 0242              Sharona Rovner, Corene Cornea, MD 08/22/22 [id number]

## 2022-08-23 DIAGNOSIS — R55 Syncope and collapse: Secondary | ICD-10-CM

## 2022-08-23 HISTORY — DX: Syncope and collapse: R55

## 2022-08-23 LAB — URINE CULTURE: Culture: NO GROWTH

## 2022-08-25 ENCOUNTER — Emergency Department (HOSPITAL_COMMUNITY): Payer: Medicare Other

## 2022-08-25 ENCOUNTER — Emergency Department (HOSPITAL_COMMUNITY)
Admission: EM | Admit: 2022-08-25 | Discharge: 2022-08-25 | Disposition: A | Discharge status: Home / Self Care | Payer: Medicare Other | Attending: Emergency Medicine | Admitting: Emergency Medicine

## 2022-08-25 ENCOUNTER — Other Ambulatory Visit: Payer: Self-pay

## 2022-08-25 ENCOUNTER — Encounter (HOSPITAL_COMMUNITY): Payer: Self-pay

## 2022-08-25 DIAGNOSIS — R55 Syncope and collapse: Secondary | ICD-10-CM | POA: Diagnosis not present

## 2022-08-25 DIAGNOSIS — R42 Dizziness and giddiness: Secondary | ICD-10-CM | POA: Diagnosis not present

## 2022-08-25 DIAGNOSIS — Z7982 Long term (current) use of aspirin: Secondary | ICD-10-CM | POA: Diagnosis not present

## 2022-08-25 DIAGNOSIS — Z043 Encounter for examination and observation following other accident: Secondary | ICD-10-CM | POA: Diagnosis not present

## 2022-08-25 DIAGNOSIS — W19XXXA Unspecified fall, initial encounter: Secondary | ICD-10-CM | POA: Diagnosis not present

## 2022-08-25 DIAGNOSIS — R791 Abnormal coagulation profile: Secondary | ICD-10-CM | POA: Insufficient documentation

## 2022-08-25 DIAGNOSIS — I4891 Unspecified atrial fibrillation: Secondary | ICD-10-CM | POA: Diagnosis not present

## 2022-08-25 DIAGNOSIS — S0101XA Laceration without foreign body of scalp, initial encounter: Secondary | ICD-10-CM | POA: Diagnosis not present

## 2022-08-25 DIAGNOSIS — R944 Abnormal results of kidney function studies: Secondary | ICD-10-CM | POA: Insufficient documentation

## 2022-08-25 DIAGNOSIS — W01198A Fall on same level from slipping, tripping and stumbling with subsequent striking against other object, initial encounter: Secondary | ICD-10-CM | POA: Insufficient documentation

## 2022-08-25 DIAGNOSIS — S0990XA Unspecified injury of head, initial encounter: Secondary | ICD-10-CM | POA: Diagnosis not present

## 2022-08-25 DIAGNOSIS — I480 Paroxysmal atrial fibrillation: Secondary | ICD-10-CM | POA: Diagnosis not present

## 2022-08-25 DIAGNOSIS — S0191XA Laceration without foreign body of unspecified part of head, initial encounter: Secondary | ICD-10-CM | POA: Diagnosis not present

## 2022-08-25 DIAGNOSIS — Y92002 Bathroom of unspecified non-institutional (private) residence single-family (private) house as the place of occurrence of the external cause: Secondary | ICD-10-CM | POA: Diagnosis not present

## 2022-08-25 LAB — MAGNESIUM: Magnesium: 2.1 mg/dL (ref 1.7–2.4)

## 2022-08-25 LAB — BASIC METABOLIC PANEL
Anion gap: 9 (ref 5–15)
BUN: 19 mg/dL (ref 8–23)
CO2: 26 mmol/L (ref 22–32)
Calcium: 8.8 mg/dL — ABNORMAL LOW (ref 8.9–10.3)
Chloride: 105 mmol/L (ref 98–111)
Creatinine, Ser: 1.29 mg/dL — ABNORMAL HIGH (ref 0.61–1.24)
GFR, Estimated: >60 mL/min (ref >60)
Glucose, Bld: 136 mg/dL — ABNORMAL HIGH (ref 70–99)
Potassium: 3.9 mmol/L (ref 3.5–5.1)
Sodium: 140 mmol/L (ref 135–145)

## 2022-08-25 LAB — CBC
HCT: 47.4 % (ref 39.0–52.0)
Hemoglobin: 15.9 g/dL (ref 13.0–17.0)
MCH: 29.6 pg (ref 26.0–34.0)
MCHC: 33.5 g/dL (ref 30.0–36.0)
MCV: 88.3 fL (ref 80.0–100.0)
Platelets: 266 10*3/uL (ref 150–400)
RBC: 5.37 MIL/uL (ref 4.22–5.81)
RDW: 12.8 % (ref 11.5–15.5)
WBC: 7.6 10*3/uL (ref 4.0–10.5)
nRBC: 0 % (ref 0.0–0.2)

## 2022-08-25 LAB — D-DIMER, QUANTITATIVE: D-Dimer, Quant: 0.68 ug/mL-FEU — ABNORMAL HIGH (ref 0.00–0.50)

## 2022-08-25 MED ORDER — IOHEXOL 350 MG/ML SOLN
75.0000 mL | Freq: Once | INTRAVENOUS | Status: AC | PRN
  Administered 2022-08-25: 75 mL via INTRAVENOUS

## 2022-08-25 MED ORDER — SODIUM CHLORIDE 0.9 % IV BOLUS
1000.0000 mL | Freq: Once | INTRAVENOUS | Status: AC
  Administered 2022-08-25: 1000 mL via INTRAVENOUS

## 2022-08-25 MED ORDER — ONDANSETRON HCL 4 MG/2ML IJ SOLN
4.0000 mg | Freq: Once | INTRAMUSCULAR | Status: AC
  Administered 2022-08-25: 4 mg via INTRAVENOUS
  Filled 2022-08-25: qty 2

## 2022-08-25 MED ORDER — TETANUS-DIPHTH-ACELL PERTUSSIS 5-2.5-18.5 LF-MCG/0.5 IM SUSY
0.5000 mL | PREFILLED_SYRINGE | Freq: Once | INTRAMUSCULAR | Status: DC
  Filled 2022-08-25: qty 0.5

## 2022-08-25 NOTE — ED Notes (Signed)
Able to ambulate in halls without assistance . Tolerated well

## 2022-08-25 NOTE — ED Provider Notes (Signed)
[facility] Provider Note   CSN: [id number] Arrival date & time: 08/25/22  0214     History  Chief Complaint  Patient presents with   Loss of Consciousness    [name] is a 67 y.o. male.  HPI     This is a 67 year old male who presents with a syncopal episode.  Patient with recent history of a foot biopsy this past Friday at [facility].  He subsequently developed urinary retention and was seen and evaluated in the emergency department on Sunday morning.  A Foley catheter was placed.  Patient states that he has had difficulty with Foley draining appropriately while he is laying flat.  He got up this morning to go to the bathroom and drain his Foley bag.  He was standing up and began to feel dizzy.  He sat down on the toilet" that is the last thing I remember."  Did not have any chest pain or shortness of breath.  No palpitations.  He is on aspirin but no other blood thinners.  Per EMS he sustained a laceration to his head.  Patient denies any history of arrhythmia or atrial fibrillation.  Home Medications Prior to Admission medications   Medication Sig Start Date End Date Taking? Authorizing Provider  Multiple Vitamin (MULTIVITAMIN) tablet Take 1 tablet by mouth daily.     [provider]  promethazine-dextromethorphan (PROMETHAZINE-DM) 6.25-15 MG/5ML syrup Take 5 mLs by mouth 4 (four) times daily as needed for cough. 04/07/22   Leath-Warren, Alda Lea, NP  sildenafil (VIAGRA) 100 MG tablet 1/2 to 1 tab po prn 01/26/22   Franchot Gallo, MD  tamsulosin (FLOMAX) 0.4 MG CAPS capsule Take 1 capsule (0.4 mg total) by mouth daily. Until stone passes 08/22/22   Mesner, Corene Cornea, MD      Allergies    Patient has no known allergies.    Review of Systems   Review of Systems  Respiratory:  Negative for shortness of breath.   Cardiovascular:  Negative for chest pain.  Skin:  Positive for wound.  Neurological:  Positive for syncope and  light-headedness.  All other systems reviewed and are negative.   Physical Exam Updated Vital Signs BP (!) 136/94   Pulse 82   Temp 97.8 F (36.6 C) (Oral)   Resp 15   Ht 1.88 m (6\' 2" )   Wt 99 kg   SpO2 98%   BMI 28.02 kg/m  Physical Exam Vitals and nursing note reviewed.  Constitutional:      Appearance: He is well-developed. He is not ill-appearing.  HENT:     Head: Normocephalic.     Comments: 3 cm laceration posterior occiput, no active bleeding Eyes:     Pupils: Pupils are equal, round, and reactive to light.  Cardiovascular:     Rate and Rhythm: Tachycardia present. Rhythm irregular.     Heart sounds: Normal heart sounds. No murmur heard. Pulmonary:     Effort: Pulmonary effort is normal. No respiratory distress.     Breath sounds: Normal breath sounds. No wheezing.  Abdominal:     General: Bowel sounds are normal.     Palpations: Abdomen is soft.     Tenderness: There is no abdominal tenderness. There is no rebound.  Musculoskeletal:     Cervical back: Neck supple.     Comments: Biopsy site left foot  Lymphadenopathy:     Cervical: No cervical adenopathy.  Skin:    General: Skin is warm and  dry.  Neurological:     Mental Status: He is alert and oriented to person, place, and time.  Psychiatric:        Mood and Affect: Mood normal.     ED Results / Procedures / Treatments   Labs (all labs ordered are listed, but only abnormal results are displayed) Labs Reviewed  BASIC METABOLIC PANEL - Abnormal; Notable for the following components:      Result Value   Glucose, Bld 136 (*)    Creatinine, Ser 1.29 (*)    Calcium 8.8 (*)    All other components within normal limits  D-DIMER, QUANTITATIVE - Abnormal; Notable for the following components:   D-Dimer, Quant 0.68 (*)    All other components within normal limits  MAGNESIUM  CBC    EKG EKG Interpretation  Date/Time:  Wednesday August 25 2022 02:20:57 EDT Ventricular Rate:  102 PR Interval:    QRS  Duration: 91 QT Interval:  338 QTC Calculation: 441 R Axis:   40 Text Interpretation: Atrial fibrillation Ventricular premature complex atrial fibrillation new when compared to prior Confirmed by Thayer Jew [phone]) on 08/25/2022 5:08:32 AM   EKG Interpretation  Date/Time:  Wednesday August 25 2022 06:07:55 EDT Ventricular Rate:  89 PR Interval:  137 QRS Duration: 87 QT Interval:  359 QTC Calculation: 437 R Axis:   35 Text Interpretation: Sinus rhythm No longer atrial fib Confirmed by Thayer Jew [phone]) on 08/25/2022 6:19:17 AM        Radiology CT Head Wo Contrast  Result Date: 08/25/2022 CLINICAL DATA:  67 year old male with syncopal episode after got up to use the restroom. Fall onto tile floor striking back of head. Laceration. EXAM: CT HEAD WITHOUT CONTRAST TECHNIQUE: Contiguous axial images were obtained from the base of the skull through the vertex without intravenous contrast. RADIATION DOSE REDUCTION: This exam was performed according to the departmental dose-optimization program which includes automated exposure control, adjustment of the mA and/or kV according to patient size and/or use of iterative reconstruction technique. COMPARISON:  None Available. FINDINGS: Brain: Cerebral volume is within normal limits for age. No midline shift, ventriculomegaly, mass effect, evidence of mass lesion, intracranial hemorrhage or evidence of cortically based acute infarction. Gray-white differentiation within normal limits for age. Vascular: No suspicious intracranial vascular hyperdensity. Skull: No acute osseous abnormality identified. Sinuses/Orbits: Inflammatory appearing moderate mucosal thickening and opacification of the left frontal sinus, frontoethmoidal recess. Superimposed bubbly opacity. Left anterior ethmoids also affected. Other visualized paranasal sinuses and mastoids are well aerated. Other: Posterior midline scalp soft tissue injury near the vertex on series 3, image 67.  Mild underlying contusion or hematoma. Calvarium appears intact. Orbits soft tissues appear negative. IMPRESSION: 1. Posterior, superior scalp soft tissue injury without underlying skull fracture. 2. Normal for age non contrast CT appearance of the brain. 3. Inflammatory appearing left frontal sinusitis. Electronically Signed   By: Genevie Ann M.D.   On: 08/25/2022 04:00    Procedures .Marland KitchenLaceration Repair  Date/Time: 08/25/2022 6:38 AM  Performed by: Merryl Hacker, MD Authorized by: Merryl Hacker, MD   Consent:    Consent obtained:  Verbal   Consent given by:  Patient   Risks discussed:  Infection and pain   Alternatives discussed:  No treatment Anesthesia:    Anesthesia method:  None Laceration details:    Location:  Scalp   Scalp location:  Crown   Length (cm):  3   Depth (mm):  3 Exploration:    Limited defect created (  wound extended): no     Imaging outcome: foreign body not noted     Wound extent: areolar tissue not violated, fascia not violated, no foreign body and no signs of injury     Contaminated: no   Treatment:    Area cleansed with:  Chlorhexidine   Amount of cleaning:  Standard   Irrigation solution:  Sterile saline   Visualized foreign bodies/material removed: no     Debridement:  None   Undermining:  None   Scar revision: no   Skin repair:    Repair method:  Staples   Number of staples:  2 Approximation:    Approximation:  Close Repair type:    Repair type:  Simple Post-procedure details:    Dressing:  Open (no dressing)   Procedure completion:  Tolerated     Medications Ordered in ED Medications  Tdap (BOOSTRIX) injection 0.5 mL (0.5 mLs Intramuscular Not Given 08/25/22 0308)  sodium chloride 0.9 % bolus 1,000 mL (0 mLs Intravenous Stopped 08/25/22 0354)  ondansetron (ZOFRAN) injection 4 mg (4 mg Intravenous Given 08/25/22 0255)  ondansetron (ZOFRAN) injection 4 mg (4 mg Intravenous Given 08/25/22 0437)  iohexol (OMNIPAQUE) 350 MG/ML injection 75 mL  (75 mLs Intravenous Contrast Given 08/25/22 0451)    ED Course/ Medical Decision Making/ A&P                             Medical Decision Making Amount and/or Complexity of Data Reviewed Labs: ordered. Radiology: ordered.  Risk Prescription drug management.   This patient presents to the ED for concern of syncope, this involves an extensive number of treatment options, and is a complaint that carries with it a high risk of complications and morbidity.  I considered the following differential and admission for this acute, potentially life threatening condition.  The differential diagnosis includes vasovagal syncope, post micturition syncope, arrhythmia, seizure, head injury  MDM:    This is a 66 year old male who presents after an episode of syncope.  He had a Foley catheter placed postoperatively but had gotten up to use the restroom.  It is probable that he may have had some post micturition vasovagal event; however, he is notable to be in atrial fibrillation.  He has no history of the same.  He is otherwise asymptomatic without palpitations or shortness of breath.  Patient was given fluids.  He is not orthostatic.  Vital signs are otherwise unremarkable.  Labs without significant metabolic derangements.  Creatinine is 1.29.  Which is comparable to prior.  Given recent surgery, D-dimer was sent to screen for PE given new onset atrial fibrillation and syncope.  D-dimer is slightly elevated at 0.68.  CT angio of the chest was obtained and does not show any evidence of blood clot.  He does have a large liver mass which has been present on prior scans.  He and wife have been made aware of this.  CT head neck without intracranial injury.  Laceration was repaired.  Patient feeling better after fluids.  He spontaneously converted out of atrial fibrillation.  He has a CHadsVasc of 1.  He is currently on aspirin.  Given relatively short period of time in A-fib and low moderate risk, will have him follow-up  closely with cardiology.  He is to continue his aspirin.  Patient ambulated independently at discharge.  This patients CHA2DS2-VASc Score and unadjusted Ischemic Stroke Rate (% per year) is equal to 0.6 % stroke rate/year  from a score of 1  Above score calculated as 1 point each if present [CHF, HTN, DM, Vascular=MI/PAD/Aortic Plaque, Age if 65-74, or Male] Above score calculated as 2 points each if present [Age > 75, or Stroke/TIA/TE]  (Labs, imaging, consults)  Labs: I Ordered, and personally interpreted labs.  The pertinent results include: CBC, BMP, mag, D-dimer  Imaging Studies ordered: I ordered imaging studies including CT head, CT angio chest, CT cervical spine I independently visualized and interpreted imaging. I agree with the radiologist interpretation  Additional history obtained from wife at bedside.  External records from outside source obtained and reviewed including outpatient records  Cardiac Monitoring: The patient was maintained on a cardiac monitor.  If on the cardiac monitor, I personally viewed and interpreted the cardiac monitored which showed an underlying rhythm of: Atrial fibrillation converted to sinus rhythm  Reevaluation: After the interventions noted above, I reevaluated the patient and found that they have :improved  Social Determinants of Health:  lives independently  Disposition: Discharge  Co morbidities that complicate the patient evaluation History reviewed. No pertinent past medical history.   Medicines Meds ordered this encounter  Medications   Tdap (BOOSTRIX) injection 0.5 mL   sodium chloride 0.9 % bolus 1,000 mL   ondansetron (ZOFRAN) injection 4 mg   ondansetron (ZOFRAN) injection 4 mg   iohexol (OMNIPAQUE) 350 MG/ML injection 75 mL    I have reviewed the patients home medicines and have made adjustments as needed  Problem List / ED Course: Problem List Items Addressed This Visit   None Visit Diagnoses     Syncope,  unspecified syncope type    -  Primary   Relevant Orders   Ambulatory referral to Cardiology   Paroxysmal A-fib       Relevant Orders   Ambulatory referral to Cardiology   Laceration of scalp, initial encounter                       Final Clinical Impression(s) / ED Diagnoses Final diagnoses:  Syncope, unspecified syncope type  Paroxysmal A-fib  Laceration of scalp, initial encounter    Rx / DC Orders ED Discharge Orders          Ordered    Ambulatory referral to Cardiology        08/25/22 0648              Merryl Hacker, MD 08/25/22 [phone]

## 2022-08-25 NOTE — ED Notes (Signed)
Pt had catheter placed on Saturday, reports the drainage bag does not fill when he is laying down only when standing.

## 2022-08-25 NOTE — ED Triage Notes (Signed)
Pt BIB [facility] for syncopal episode. Pt states he got up to use the restroom when he had a syncopal episode, falling onto tile floor hitting back of head. Laceration noted on back of head, bleeding controlled at this time. Denies taking blood thinners.

## 2022-08-25 NOTE — ED Notes (Signed)
Patient transported to CT 

## 2022-08-25 NOTE — Discharge Instructions (Signed)
You were seen today after an episode of passing out.  This could have been for multiple reasons including dehydration, vasovagal episode.  You were noted to be in atrial fibrillation which is new for you.  You converted to sinus rhythm.  Continue your aspirin.  Follow-up closely with cardiology.  A referral has been placed.  Additionally, you need to have staples removed in approximately 10 days.  Continue your Foley catheter.  Make sure that you are staying hydrated.

## 2022-08-30 ENCOUNTER — Telehealth: Payer: Self-pay

## 2022-08-30 NOTE — Telephone Encounter (Signed)
Patient/Wife voiced that patient was in the hospital for foot surgery and patient developed urinary retention. Patient/wife states that patient was feeling light headed and dizzied which cause him to past out. Patient/wife states he was taking to the ED for evaluation but no urinalysis was done during the ED visit. Patient/wife drop off urine sample. After reviewing ED notes tried calling patient/wife to find out if patient still have foley catheter.

## 2022-08-30 NOTE — Progress Notes (Signed)
History of Present Illness: He follows up for elevated PSA.  Because of a PSA of 5.5 in September 2021 he underwent ultrasound and biopsy of his prostate in [facility] by Dr. Apolinar Junes.  Prostate was found to be 170 mL.  PSA density 0.03.  All 12 cores were negative.  9.5.2023: PSA 7.0 .  Overall, he voids adequately.  He did have an issue last night with waking up 4 times in the slow stream with hesitancy.  That is unusual, however. IPSS 7, quality-of-life score 3.   He does have mild ED  4.9.2024:  No past medical history on file.  Past Surgical History:  Procedure Laterality Date   BACK SURGERY     COLONOSCOPY  06/23/2011   Procedure: COLONOSCOPY;  Surgeon: Malissa Hippo, MD;  [facility];  Service: Endoscopy;  Laterality: N/A;  3:00   COLONOSCOPY N/A 07/15/2016   Procedure: COLONOSCOPY;  Surgeon: Malissa Hippo, MD;  [facility];  Service: Endoscopy;  Laterality: N/A;  830   COLONOSCOPY WITH PROPOFOL N/A 08/27/2021   Procedure: COLONOSCOPY WITH PROPOFOL;  Surgeon: Malissa Hippo, MD;  [facility];  Service: Endoscopy;  Laterality: N/A;  730    Home Medications:  Allergies as of 08/31/2022   No Known Allergies      Medication List        Accurate as of August 30, 2022  8:04 PM. If you have any questions, ask your nurse or doctor.          multivitamin tablet Take 1 tablet by mouth daily.   promethazine-dextromethorphan 6.25-15 MG/5ML syrup Commonly known as: PROMETHAZINE-DM Take 5 mLs by mouth 4 (four) times daily as needed for cough.   sildenafil 100 MG tablet Commonly known as: VIAGRA 1/2 to 1 tab po prn   tamsulosin 0.4 MG Caps capsule Commonly known as: Flomax Take 1 capsule (0.4 mg total) by mouth daily. Until stone passes        Allergies: No Known Allergies  Family History  Problem Relation Age of Onset   Cancer Father     Social History:  reports that he has never smoked. He has never used  smokeless tobacco. He reports that he does not drink alcohol and does not use drugs.  ROS: A complete review of systems was performed.  All systems are negative except for pertinent findings as noted.  Physical Exam:  Vital signs in last 24 hours: There were no vitals taken for this visit. Constitutional:  Alert and oriented, No acute distress Cardiovascular: Regular rate  Respiratory: Normal respiratory effort GI: Abdomen is soft, nontender, nondistended, no abdominal masses. No CVAT.  Genitourinary: Normal male phallus, testes are descended bilaterally and non-tender and without masses, scrotum is normal in appearance without lesions or masses, perineum is normal on inspection. Lymphatic: No lymphadenopathy Neurologic: Grossly intact, no focal deficits Psychiatric: Normal mood and affect  I have reviewed prior pt notes  I have reviewed urinalysis results  I have independently reviewed prior imaging  I have reviewed prior PSA results    Impression/Assessment:  ***  Plan:  ***

## 2022-08-31 ENCOUNTER — Encounter: Payer: Self-pay | Admitting: Urology

## 2022-08-31 ENCOUNTER — Ambulatory Visit: Payer: Medicare Other | Admitting: Urology

## 2022-08-31 ENCOUNTER — Other Ambulatory Visit: Payer: Medicare Other

## 2022-08-31 ENCOUNTER — Other Ambulatory Visit: Payer: Self-pay

## 2022-08-31 VITALS — BP 130/71 | HR 112

## 2022-08-31 DIAGNOSIS — R972 Elevated prostate specific antigen [PSA]: Secondary | ICD-10-CM

## 2022-08-31 DIAGNOSIS — N401 Enlarged prostate with lower urinary tract symptoms: Secondary | ICD-10-CM | POA: Diagnosis not present

## 2022-08-31 DIAGNOSIS — R338 Other retention of urine: Secondary | ICD-10-CM

## 2022-08-31 DIAGNOSIS — N400 Enlarged prostate without lower urinary tract symptoms: Secondary | ICD-10-CM

## 2022-08-31 MED ORDER — CIPROFLOXACIN HCL 500 MG PO TABS
500.0000 mg | ORAL_TABLET | Freq: Once | ORAL | Status: AC
Start: 2022-08-31 — End: 2022-08-31
  Administered 2022-08-31: 500 mg via ORAL

## 2022-08-31 MED ORDER — FINASTERIDE 5 MG PO TABS
5.0000 mg | ORAL_TABLET | Freq: Every day | ORAL | 11 refills | Status: DC
Start: 2022-08-31 — End: 2022-10-22

## 2022-08-31 NOTE — Progress Notes (Addendum)
Fill and Pull Catheter Removal  Patient is present today for a catheter removal.  Patient was cleaned and prepped in a sterile fashion of sterile water/ saline was instilled into the bladder when the patient felt the urge to urinate. 68ml of water was then drained from the balloon.  A 16FR foley cath was removed from the bladder no complications were noted .  Patient as then given some time to void on their own.  Patient can void  on their own after some time.  Patient tolerated well.  Performed by: Guss Bunde, CMA  Follow up/ Additional notes: MD to see after    Patient return to clinic unable to void.  PVR= .  Verbal from Dr. Retta Diones to place catheter.    Simple Catheter Placement  Due to urinary retention patient is present today for a foley cath placement.  Patient was cleaned and prepped in a sterile fashion with betadine and 2% lidocaine jelly was instilled into the urethra. A 16 FR foley catheter was inserted, urine return was noted  , urine was yellow in color.  The balloon was filled with 10cc of sterile water.  A leg bag was attached for drainage.  Patient was given instruction on proper catheter care.  Patient tolerated well, no complications were noted   Performed by: Guss Bunde, CMA  Additional notes/ Follow up: MD to see after

## 2022-09-02 DIAGNOSIS — D480 Neoplasm of uncertain behavior of bone and articular cartilage: Secondary | ICD-10-CM | POA: Diagnosis not present

## 2022-09-06 ENCOUNTER — Other Ambulatory Visit: Payer: Self-pay | Admitting: Family Medicine

## 2022-09-06 DIAGNOSIS — R160 Hepatomegaly, not elsewhere classified: Secondary | ICD-10-CM | POA: Diagnosis not present

## 2022-09-06 DIAGNOSIS — I480 Paroxysmal atrial fibrillation: Secondary | ICD-10-CM | POA: Diagnosis not present

## 2022-09-06 DIAGNOSIS — Z79899 Other long term (current) drug therapy: Secondary | ICD-10-CM | POA: Diagnosis not present

## 2022-09-06 DIAGNOSIS — N401 Enlarged prostate with lower urinary tract symptoms: Secondary | ICD-10-CM | POA: Diagnosis not present

## 2022-09-06 DIAGNOSIS — N138 Other obstructive and reflux uropathy: Secondary | ICD-10-CM | POA: Diagnosis not present

## 2022-09-06 NOTE — Progress Notes (Signed)
History of Present Illness: [name] is here today for follow-up.  He was seen last week for urinary retention.  He has a prostate volume of 170 mL.  He failed a voiding trial and comes back with a Foley catheter.  He has been having some spasms.  Urine has been bloody at times.  Has had no fever or chills.  He is on finasteride and tamsulosin now.  He has multiple questions.  No past medical history on file.  Past Surgical History:  Procedure Laterality Date   BACK SURGERY     COLONOSCOPY  06/23/2011   Procedure: COLONOSCOPY;  Surgeon: Malissa Hippo, MD;  [facility];  Service: Endoscopy;  Laterality: N/A;  3:00   COLONOSCOPY N/A 07/15/2016   Procedure: COLONOSCOPY;  Surgeon: Malissa Hippo, MD;  [facility];  Service: Endoscopy;  Laterality: N/A;  830   COLONOSCOPY WITH PROPOFOL N/A 08/27/2021   Procedure: COLONOSCOPY WITH PROPOFOL;  Surgeon: Malissa Hippo, MD;  [facility];  Service: Endoscopy;  Laterality: N/A;  730    Home Medications:  Allergies as of 09/07/2022   No Known Allergies      Medication List        Accurate as of September 06, 2022  8:16 PM. If you have any questions, ask your nurse or doctor.          finasteride 5 MG tablet Commonly known as: PROSCAR Take 1 tablet (5 mg total) by mouth daily.   multivitamin tablet Take 1 tablet by mouth daily.   promethazine-dextromethorphan 6.25-15 MG/5ML syrup Commonly known as: PROMETHAZINE-DM Take 5 mLs by mouth 4 (four) times daily as needed for cough.   sildenafil 100 MG tablet Commonly known as: VIAGRA 1/2 to 1 tab po prn   tamsulosin 0.4 MG Caps capsule Commonly known as: Flomax Take 1 capsule (0.4 mg total) by mouth daily. Until stone passes        Allergies: No Known Allergies  Family History  Problem Relation Age of Onset   Cancer Father     Social History:  reports that he has never smoked. He has never used smokeless tobacco. He reports that he does not  drink alcohol and does not use drugs.  ROS: A complete review of systems was performed.  All systems are negative except for pertinent findings as noted.  Physical Exam:  Vital signs in last 24 hours: There were no vitals taken for this visit. Constitutional:  Alert and oriented, No acute distress Cardiovascular: Regular rate  Respiratory: Normal respiratory effort Psychiatric: Normal mood and affect  I have reviewed prior pt notes  I have reviewed notes from referring/previous physicians  I have reviewed urinalysis results  I have independently reviewed prior imaging  I have reviewed prior PSA results  I have reviewed prior urine culture  Following failed voiding trial, I attempted to teach him self intermittent catheterization.  We tried with first a straight and then a coud prelubricated 16 French catheter.  Despite him using good technique, he could not reach the bladder.  I then passed an 24 French Foley catheter, balloon filled with 10 cc of water and it was hooked to dependent drainage. Impression/Assessment:  BPH with huge gland and subsequent retention.  Failed voiding trial and he is not really a candidate for intermittent catheterization  Plan:  I will set up an appointment for him to see Dr. Richardo Hanks at [facility] urological to see for possible HoLEP  I will  set up an appointment for him to see [place] in a month in case he needs catheter change before that appointment

## 2022-09-07 ENCOUNTER — Encounter: Payer: Self-pay | Admitting: Urology

## 2022-09-07 ENCOUNTER — Ambulatory Visit: Payer: Medicare Other | Admitting: Urology

## 2022-09-07 VITALS — BP 126/84 | HR 101

## 2022-09-07 DIAGNOSIS — N401 Enlarged prostate with lower urinary tract symptoms: Secondary | ICD-10-CM

## 2022-09-07 DIAGNOSIS — R338 Other retention of urine: Secondary | ICD-10-CM

## 2022-09-07 DIAGNOSIS — R972 Elevated prostate specific antigen [PSA]: Secondary | ICD-10-CM | POA: Diagnosis not present

## 2022-09-07 NOTE — Addendum Note (Signed)
Addended by: Christoper Fabian R on: 09/07/2022 04:39 PM   Modules accepted: Orders

## 2022-09-07 NOTE — Progress Notes (Signed)
Fill and Pull Catheter Removal  Patient is present today for a catheter removal.  Patient was cleaned and prepped in a sterile fashion 400 ml of sterile water/ saline was instilled into the bladder when the patient felt the urge to urinate. 0 ml of water was then drained from the balloon.  A 16FR foley cath was removed from the bladder no complications were noted .  Patient as then given some time to void on their own.  Patient cannot void  0 ml on their own after some time.  Patient tolerated well.  Performed by: Kennyth Lose, CMA  Follow up/ Additional notes: Keep scheduled office visit

## 2022-09-09 ENCOUNTER — Encounter (HOSPITAL_COMMUNITY): Payer: Self-pay | Admitting: *Deleted

## 2022-09-09 ENCOUNTER — Emergency Department (HOSPITAL_COMMUNITY): Payer: Medicare Other

## 2022-09-09 ENCOUNTER — Emergency Department (HOSPITAL_COMMUNITY)
Admission: EM | Admit: 2022-09-09 | Discharge: 2022-09-09 | Disposition: A | Discharge status: Home / Self Care | Payer: Medicare Other | Attending: Emergency Medicine | Admitting: Emergency Medicine

## 2022-09-09 ENCOUNTER — Other Ambulatory Visit: Payer: Self-pay

## 2022-09-09 ENCOUNTER — Ambulatory Visit: Payer: Medicare Other

## 2022-09-09 DIAGNOSIS — N4889 Other specified disorders of penis: Secondary | ICD-10-CM | POA: Diagnosis not present

## 2022-09-09 DIAGNOSIS — M549 Dorsalgia, unspecified: Secondary | ICD-10-CM | POA: Diagnosis not present

## 2022-09-09 DIAGNOSIS — N210 Calculus in bladder: Secondary | ICD-10-CM | POA: Diagnosis not present

## 2022-09-09 DIAGNOSIS — N401 Enlarged prostate with lower urinary tract symptoms: Secondary | ICD-10-CM

## 2022-09-09 DIAGNOSIS — D1803 Hemangioma of intra-abdominal structures: Secondary | ICD-10-CM | POA: Diagnosis not present

## 2022-09-09 DIAGNOSIS — R109 Unspecified abdominal pain: Secondary | ICD-10-CM | POA: Diagnosis not present

## 2022-09-09 DIAGNOSIS — Z7982 Long term (current) use of aspirin: Secondary | ICD-10-CM | POA: Insufficient documentation

## 2022-09-09 DIAGNOSIS — R55 Syncope and collapse: Secondary | ICD-10-CM | POA: Diagnosis not present

## 2022-09-09 DIAGNOSIS — N23 Unspecified renal colic: Secondary | ICD-10-CM

## 2022-09-09 DIAGNOSIS — N132 Hydronephrosis with renal and ureteral calculous obstruction: Secondary | ICD-10-CM | POA: Diagnosis not present

## 2022-09-09 DIAGNOSIS — M545 Low back pain, unspecified: Secondary | ICD-10-CM | POA: Diagnosis not present

## 2022-09-09 DIAGNOSIS — R9431 Abnormal electrocardiogram [ECG] [EKG]: Secondary | ICD-10-CM | POA: Diagnosis not present

## 2022-09-09 DIAGNOSIS — I4891 Unspecified atrial fibrillation: Secondary | ICD-10-CM | POA: Diagnosis not present

## 2022-09-09 HISTORY — DX: Unspecified atrial fibrillation: I48.91

## 2022-09-09 LAB — CBC WITH DIFFERENTIAL/PLATELET
Abs Immature Granulocytes: 0.03 10*3/uL (ref 0.00–0.07)
Basophils Absolute: 0 10*3/uL (ref 0.0–0.1)
Basophils Relative: 1 %
Eosinophils Absolute: 0.1 10*3/uL (ref 0.0–0.5)
Eosinophils Relative: 1 %
HCT: 42.6 % (ref 39.0–52.0)
Hemoglobin: 14.2 g/dL (ref 13.0–17.0)
Immature Granulocytes: 0 %
Lymphocytes Relative: 10 %
Lymphs Abs: 0.8 10*3/uL (ref 0.7–4.0)
MCH: 29.7 pg (ref 26.0–34.0)
MCHC: 33.3 g/dL (ref 30.0–36.0)
MCV: 89.1 fL (ref 80.0–100.0)
Monocytes Absolute: 0.4 10*3/uL (ref 0.1–1.0)
Monocytes Relative: 5 %
Neutro Abs: 6.5 10*3/uL (ref 1.7–7.7)
Neutrophils Relative %: 83 %
Platelets: 258 10*3/uL (ref 150–400)
RBC: 4.78 MIL/uL (ref 4.22–5.81)
RDW: 12.6 % (ref 11.5–15.5)
WBC: 7.8 10*3/uL (ref 4.0–10.5)
nRBC: 0 % (ref 0.0–0.2)

## 2022-09-09 LAB — COMPREHENSIVE METABOLIC PANEL
ALT: 20 U/L (ref 0–44)
AST: 17 U/L (ref 15–41)
Albumin: 4 g/dL (ref 3.5–5.0)
Alkaline Phosphatase: 71 U/L (ref 38–126)
Anion gap: 10 (ref 5–15)
BUN: 17 mg/dL (ref 8–23)
CO2: 23 mmol/L (ref 22–32)
Calcium: 8.5 mg/dL — ABNORMAL LOW (ref 8.9–10.3)
Chloride: 104 mmol/L (ref 98–111)
Creatinine, Ser: 1.19 mg/dL (ref 0.61–1.24)
GFR, Estimated: >60 mL/min (ref >60)
Glucose, Bld: 157 mg/dL — ABNORMAL HIGH (ref 70–99)
Potassium: 3.7 mmol/L (ref 3.5–5.1)
Sodium: 137 mmol/L (ref 135–145)
Total Bilirubin: 0.3 mg/dL (ref 0.3–1.2)
Total Protein: 7.6 g/dL (ref 6.5–8.1)

## 2022-09-09 LAB — URINALYSIS, ROUTINE W REFLEX MICROSCOPIC
Glucose, UA: 100 mg/dL — AB
Ketones, ur: NEGATIVE mg/dL
Nitrite: NEGATIVE
Protein, ur: 100 mg/dL — AB
Specific Gravity, Urine: 1.015 (ref 1.005–1.030)
pH: 6 (ref 5.0–8.0)

## 2022-09-09 LAB — URINALYSIS, MICROSCOPIC (REFLEX): RBC / HPF: >50 RBC/hpf (ref 0–5)

## 2022-09-09 LAB — LIPASE, BLOOD: Lipase: 40 U/L (ref 11–51)

## 2022-09-09 MED ORDER — HYDROMORPHONE HCL 1 MG/ML IJ SOLN
1.0000 mg | Freq: Once | INTRAMUSCULAR | Status: AC
  Administered 2022-09-09: 1 mg via INTRAVENOUS
  Filled 2022-09-09: qty 1

## 2022-09-09 MED ORDER — HYDROCODONE-ACETAMINOPHEN 5-325 MG PO TABS: 1.0000 | ORAL_TABLET | Freq: Three times a day (TID) | ORAL | 0 refills | Status: DC | PRN

## 2022-09-09 MED ORDER — ONDANSETRON 8 MG PO TBDP: 8.0000 mg | ORAL_TABLET | Freq: Three times a day (TID) | ORAL | 0 refills | Status: DC | PRN

## 2022-09-09 MED ORDER — ONDANSETRON HCL 4 MG/2ML IJ SOLN
4.0000 mg | Freq: Once | INTRAMUSCULAR | Status: AC
  Administered 2022-09-09: 4 mg via INTRAVENOUS
  Filled 2022-09-09: qty 2

## 2022-09-09 MED ORDER — KETOROLAC TROMETHAMINE 15 MG/ML IJ SOLN
15.0000 mg | Freq: Once | INTRAMUSCULAR | Status: AC
  Administered 2022-09-09: 15 mg via INTRAVENOUS
  Filled 2022-09-09: qty 1

## 2022-09-09 MED ORDER — NAPROXEN 500 MG PO TABS: 500.0000 mg | ORAL_TABLET | Freq: Two times a day (BID) | ORAL | 0 refills | Status: DC

## 2022-09-09 MED ORDER — SODIUM CHLORIDE 0.9 % IV BOLUS
1000.0000 mL | Freq: Once | INTRAVENOUS | Status: AC
  Administered 2022-09-09: 1000 mL via INTRAVENOUS

## 2022-09-09 NOTE — Discharge Instructions (Addendum)
We saw you in the ER for the abdominal pain. Our results indicate that you have a kidney stone. We were able to get your pain is relative control, and we can safely send you home.  Take the meds prescribed. Set up an appointment with the Urologist. If the pain is unbearable, you start having fevers, chills, and are unable to keep any meds down - then return to the ER.  Additionally, the CT scan reveals that you have a liver mass, that appears to be hemangioma.  Your primary care doctor needs to monitor it.  Please share this finding with your primary care doctor.

## 2022-09-09 NOTE — ED Triage Notes (Signed)
Pt BIB [facility] for left lower back pain that started today with nausea.  Zofran given IV en route.  Pt with foley catheter in place. Pt with recent dx of AFIB

## 2022-09-09 NOTE — ED Provider Notes (Signed)
[place] EMERGENCY DEPARTMENT AT [facility] Provider Note   CSN: [id number] Arrival date & time: 09/09/22  1320     History  Chief Complaint  Patient presents with   Back Pain    [name] is a 67 y.o. male.   Back Pain    Patient has a history of atrial fibrillation, syncope, urinary retention with indwelling Foley catheter.  Patient presents to the ED for evaluation of acute back pain.  Patient states a couple weeks ago he had a biopsy of a lesion on his foot.  This ultimately turned out to be benign but subsequently he had an episode of urinary retention, syncope and an episode of A-fib.  Patient is waiting for outpatient cardiology follow-up.  Patient did follow-up with urology and was diagnosed with an enlarged prostate.  He attempted self-catheterization but was unsuccessful so the patient has continued with an indwelling Foley catheter.  He has continued to have some blood in his urine since the procedure.  Patient was actually evaluated by his urologist today.  This afternoon he started developing severe pain in his left flank.  It is 7 out of 10 in nature.  He also feels nauseated.  He did feel lightheaded.  Home Medications Prior to Admission medications   Medication Sig Start Date End Date Taking? Authorizing Provider  aspirin EC 81 MG tablet Take 81 mg by mouth daily.   Yes [provider]  finasteride (PROSCAR) 5 MG tablet Take 1 tablet (5 mg total) by mouth daily. 08/31/22  Yes Dahlstedt, Jeannett Senior, MD  HYDROcodone-acetaminophen (NORCO/VICODIN) 5-325 MG tablet Take 1 tablet by mouth every 8 (eight) hours as needed. 09/09/22  Yes Derwood Kaplan, MD  Multiple Vitamin (MULTIVITAMIN) tablet Take 1 tablet by mouth daily.    Yes [provider]  naproxen (NAPROSYN) 500 MG tablet Take 1 tablet (500 mg total) by mouth 2 (two) times daily. 09/09/22  Yes Nanavati, Ankit, MD  ondansetron (ZOFRAN-ODT) 8 MG disintegrating tablet Take 1 tablet (8 mg  total) by mouth every 8 (eight) hours as needed for nausea. 09/09/22  Yes Derwood Kaplan, MD  oxyCODONE (OXY IR/ROXICODONE) 5 MG immediate release tablet Take 5 mg by mouth every 4 (four) hours as needed. 08/20/22  Yes [provider]  sildenafil (VIAGRA) 100 MG tablet 1/2 to 1 tab po prn 01/26/22  Yes Dahlstedt, Jeannett Senior, MD  tamsulosin (FLOMAX) 0.4 MG CAPS capsule Take 1 capsule (0.4 mg total) by mouth daily. Until stone passes 08/22/22  Yes Mesner, Barbara Cower, MD      Allergies    Patient has no known allergies.    Review of Systems   Review of Systems  Musculoskeletal:  Positive for back pain.    Physical Exam Updated Vital Signs BP (!) 141/78   Pulse (!) 103   Temp 98.1 F (36.7 C) (Oral)   Resp 17   Ht 1.88 m ( )   Wt 97.5 kg   SpO2 100%   BMI 27.60 kg/m  Physical Exam Vitals and nursing note reviewed.  Constitutional:      Appearance: He is well-developed. He is ill-appearing.  HENT:     Head: Normocephalic and atraumatic.     Right Ear: External ear normal.     Left Ear: External ear normal.  Eyes:     General: No scleral icterus.       Right eye: No discharge.        Left eye: No discharge.  Conjunctiva/sclera: Conjunctivae normal.  Neck:     Trachea: No tracheal deviation.  Cardiovascular:     Rate and Rhythm: Normal rate and regular rhythm.  Pulmonary:     Effort: Pulmonary effort is normal. No respiratory distress.     Breath sounds: Normal breath sounds. No stridor. No wheezing or rales.  Abdominal:     General: Bowel sounds are normal. There is no distension.     Palpations: Abdomen is soft.     Tenderness: There is no abdominal tenderness. There is left CVA tenderness. There is no guarding or rebound.  Genitourinary:    Comments: Urine appears bloody Musculoskeletal:        General: No tenderness or deformity.     Cervical back: Neck supple.  Skin:    General: Skin is warm and dry.     Findings: No rash.  Neurological:     General: No  focal deficit present.     Mental Status: He is alert.     Cranial Nerves: No cranial nerve deficit, dysarthria or facial asymmetry.     Sensory: No sensory deficit.     Motor: No abnormal muscle tone or seizure activity.     Coordination: Coordination normal.  Psychiatric:        Mood and Affect: Mood normal.     ED Results / Procedures / Treatments   Labs (all labs ordered are listed, but only abnormal results are displayed) Labs Reviewed  COMPREHENSIVE METABOLIC PANEL - Abnormal; Notable for the following components:      Result Value   Glucose, Bld 157 (*)    Calcium 8.5 (*)    All other components within normal limits  URINALYSIS, ROUTINE W REFLEX MICROSCOPIC - Abnormal; Notable for the following components:   APPearance HAZY (*)    Glucose, UA 100 (*)    Hgb urine dipstick LARGE (*)    Bilirubin Urine MODERATE (*)    Protein, ur 100 (*)    Leukocytes,Ua MODERATE (*)    All other components within normal limits  URINALYSIS, MICROSCOPIC (REFLEX) - Abnormal; Notable for the following components:   Bacteria, UA FEW (*)    All other components within normal limits  LIPASE, BLOOD  CBC WITH DIFFERENTIAL/PLATELET    EKG EKG Interpretation  Date/Time:  Thursday September 09 2022 14:50:24 EDT Ventricular Rate:  98 PR Interval:    QRS Duration: 89 QT Interval:  332 QTC Calculation: 424 R Axis:   50 Text Interpretation: Atrial fibrillation Ventricular premature complex a fib new since last tracing Confirmed by Linwood Dibbles [phone]) on 09/09/2022 2:58:40 PM  Radiology CT RENAL STONE STUDY  Result Date: 09/09/2022 CLINICAL DATA:  Acute left flank pain. EXAM: CT ABDOMEN AND PELVIS WITHOUT CONTRAST TECHNIQUE: Multidetector CT imaging of the abdomen and pelvis was performed following the standard protocol without IV contrast. RADIATION DOSE REDUCTION: This exam was performed according to the departmental dose-optimization program which includes automated exposure control, adjustment of  the mA and/or kV according to patient size and/or use of iterative reconstruction technique. COMPARISON:  January 08, 2018. FINDINGS: Lower chest: No acute abnormality. Hepatobiliary: No cholelithiasis or biliary dilatation is noted. Ill-defined and slightly complex rounded hypoechoic abnormality is again noted in posterior segment of right hepatic lobe which may be slightly enlarged compared to prior exam, currently measuring 10 x 7 cm. Pancreas: Unremarkable. No pancreatic ductal dilatation or surrounding inflammatory changes. Spleen: Normal in size without focal abnormality. Adrenals/Urinary Tract: Adrenal glands appear normal. Small nonobstructive right renal  calculus is noted. Mild left hydronephrosis is noted secondary to 5 mm calculus in distal left ureter. Urinary bladder is decompressed secondary to Foley catheter. Multiple large bladder calculi are noted on the right side of the urinary bladder. The largest measures 15 mm in diameter. Stomach/Bowel: Stomach is unremarkable. Appendix is not clearly visualized. No colonic dilatation is noted. Mildly dilated and air-filled small bowel loops are noted in left side of abdomen concerning for ileus or possibly partial small bowel obstruction. Vascular/Lymphatic: Aortic atherosclerosis. No enlarged abdominal or pelvic lymph nodes. Reproductive: Severe prostatic enlargement is again noted. Other: No abdominal wall hernia or abnormality. No abdominopelvic ascites. Musculoskeletal: No acute or significant osseous findings. IMPRESSION: Mild left hydronephrosis is noted secondary to 5 mm calculus in distal left ureter. Small nonobstructive right renal calculus. Multiple bladder calculi are noted, the largest measuring 15 mm. Mildly dilated and air-filled small bowel loops are noted in left side of the abdomen concerning for ileus or possibly partial small bowel obstruction. 10 x 7 cm ill-defined slightly complex rounded hypoechoic abnormality is noted in posterior  segment of right hepatic lobe which appears to be slightly enlarged compared to prior exam. Given the lack of change, this most likely represents hemangioma or other benign pathology, but neoplasm cannot be excluded. When the patient is clinically stable and able to follow directions and hold their breath (preferably as an outpatient) further evaluation with dedicated abdominal MRI should be considered. Severe prostatic enlargement. Aortic Atherosclerosis (ICD10-I70.0). Electronically Signed   By: Lupita Raider M.D.   On: 09/09/2022 16:22    Procedures Procedures    Medications Ordered in ED Medications  sodium chloride 0.9 % bolus 1,000 mL (0 mLs Intravenous Stopped 09/09/22 1608)  HYDROmorphone (DILAUDID) injection 1 mg (1 mg Intravenous Given 09/09/22 1427)  ondansetron (ZOFRAN) injection 4 mg (4 mg Intravenous Given 09/09/22 1426)  ketorolac (TORADOL) 15 MG/ML injection 15 mg (15 mg Intravenous Given 09/09/22 1649)    ED Course/ Medical Decision Making/ A&P                             Medical Decision Making Ddx includes but not limited to biliary colic, diverticulitis, musculoskeletal back, apin, aaa  Amount and/or Complexity of Data Reviewed Labs: ordered. Radiology: ordered.  Risk Prescription drug management. Parenteral controlled substances. Drug therapy requiring intensive monitoring for toxicity.   Pt with complex recent medical history.  Presented with acute flank pain.  Sx suggestive of ureteral colic.  Pt treated with IV fluids, pain medications.  Labs and CT imaging ordered.  Workup pending at shift change.  Care turned over to Dr Rhunette Croft.        Final Clinical Impression(s) / ED Diagnoses Final diagnoses:  Ureteral colic  Hemangioma of liver  Bladder stone    Rx / DC Orders ED Discharge Orders          Ordered    HYDROcodone-acetaminophen (NORCO/VICODIN) 5-325 MG tablet  Every 8 hours PRN        09/09/22 1727    ondansetron (ZOFRAN-ODT) 8 MG  disintegrating tablet  Every 8 hours PRN        09/09/22 1727    naproxen (NAPROSYN) 500 MG tablet  2 times daily        09/09/22 1729    Amb referral to AFIB Clinic        09/09/22 1733  Linwood Dibbles, MD 09/11/22 0730

## 2022-09-09 NOTE — ED Notes (Signed)
Pt returned from CT °

## 2022-09-09 NOTE — ED Provider Notes (Addendum)
  Physical Exam  BP (!) 138/98   Pulse (!) 103   Temp 98.4 F (36.9 C) (Oral)   Resp 18   Ht  (1.88 m)   Wt 97.5 kg   SpO2 100%   BMI 27.60 kg/m   Physical Exam  Procedures  Procedures  ED Course / MDM    Medical Decision Making Amount and/or Complexity of Data Reviewed Labs: ordered. Radiology: ordered.  Risk Prescription drug management.   Assuming care of patient from Dr. Lynelle Doctor   Patient in the ED for low back pain, nausea, recent AF and urinary retention with foley. Back pain is new, L flank. Labs are reassuring.  Important pending results are CT renal stone.  According to Dr. Lynelle Doctor, plan is to dispo per CT and reassessment.   Patient had no complains, no concerns from the nursing side. Will continue to monitor.     Derwood Kaplan, MD 09/09/22 1549   5:31 PM CT scan positive for stone, mild hydronephrosis.  He also has bladder stone, the patient is aware of.  Results of the ER workup discussed with the patient.  Return precautions also discussed.  Stable for discharge.  Advised to follow-up with Dr. Retta Diones.  I will send in a referral to A-fib clinic.  It appears that his cardiology follow-up is more than a month away, and he has a lot of questions pertaining to management of his A-fib.   Derwood Kaplan, MD 09/09/22 1731

## 2022-09-09 NOTE — Progress Notes (Addendum)
Patient in office complaining of catheter pulling and discomfort.  Foley catheter noted to be intact and draining properly. Foley catheter readjusted with a catheter anchor applied. Dr. Annabell Howells made aware of patients discomfort/irritation around the head of his penis. Per MD patient can apply neosporin plus to affected area.  Patient states catheter feels better and will keep scheduled f/u.

## 2022-09-17 ENCOUNTER — Encounter: Payer: Self-pay | Admitting: Urology

## 2022-09-17 ENCOUNTER — Ambulatory Visit
Admission: RE | Admit: 2022-09-17 | Discharge: 2022-09-17 | Disposition: A | Discharge status: Home / Self Care | Payer: Medicare Other | Source: Ambulatory Visit | Attending: Family Medicine | Admitting: Family Medicine

## 2022-09-17 DIAGNOSIS — R160 Hepatomegaly, not elsewhere classified: Secondary | ICD-10-CM

## 2022-09-17 MED ORDER — GADOPICLENOL 0.5 MMOL/ML IV SOLN
10.0000 mL | Freq: Once | INTRAVENOUS | Status: AC | PRN
  Administered 2022-09-17: 10 mL via INTRAVENOUS

## 2022-09-20 ENCOUNTER — Other Ambulatory Visit: Payer: Self-pay | Admitting: Urology

## 2022-09-20 ENCOUNTER — Telehealth: Payer: Self-pay

## 2022-09-20 DIAGNOSIS — N401 Enlarged prostate with lower urinary tract symptoms: Secondary | ICD-10-CM

## 2022-09-20 MED ORDER — TAMSULOSIN HCL 0.4 MG PO CAPS
0.4000 mg | ORAL_CAPSULE | Freq: Every day | ORAL | 11 refills | Status: DC
Start: 2022-09-20 — End: 2022-10-22

## 2022-09-20 NOTE — Telephone Encounter (Signed)
"  Please call patient-if tamsulosin has not been sent in, I will do this.  See if we have that catheter securing system.  If we not have it, you can tell him that you continues to take to help secure the catheter to the leg.  Tamsulosin will help kidney stones passed-this stone is down near the bladder.  He also has multiple bladder stones, these can be remedied at the same time as the procedure for the prostate.  Return call to patient. tamsulosin Rx sent to pharmacy,  catheter anchor up front for patient and gave patient [facility] urology phone number for referral sent for HoLEP consulted with Dr. Richardo Hanks. Patient voiced understanding

## 2022-09-22 ENCOUNTER — Ambulatory Visit: Payer: Medicare Other | Attending: Internal Medicine | Admitting: Internal Medicine

## 2022-09-22 ENCOUNTER — Encounter: Payer: Self-pay | Admitting: Internal Medicine

## 2022-09-22 ENCOUNTER — Other Ambulatory Visit (HOSPITAL_COMMUNITY)
Admission: RE | Admit: 2022-09-22 | Discharge: 2022-09-22 | Disposition: A | Discharge status: Home / Self Care | Payer: Medicare Other | Source: Ambulatory Visit | Attending: Internal Medicine | Admitting: Internal Medicine

## 2022-09-22 ENCOUNTER — Other Ambulatory Visit (INDEPENDENT_AMBULATORY_CARE_PROVIDER_SITE_OTHER): Payer: Medicare Other

## 2022-09-22 VITALS — BP 134/87 | HR 85 | Ht 74.0 in | Wt 221.4 lb

## 2022-09-22 DIAGNOSIS — R55 Syncope and collapse: Secondary | ICD-10-CM | POA: Diagnosis not present

## 2022-09-22 DIAGNOSIS — R42 Dizziness and giddiness: Secondary | ICD-10-CM | POA: Insufficient documentation

## 2022-09-22 DIAGNOSIS — I4891 Unspecified atrial fibrillation: Secondary | ICD-10-CM

## 2022-09-22 DIAGNOSIS — I480 Paroxysmal atrial fibrillation: Secondary | ICD-10-CM

## 2022-09-22 DIAGNOSIS — G4733 Obstructive sleep apnea (adult) (pediatric): Secondary | ICD-10-CM

## 2022-09-22 DIAGNOSIS — Z01810 Encounter for preprocedural cardiovascular examination: Secondary | ICD-10-CM | POA: Insufficient documentation

## 2022-09-22 LAB — TSH: TSH: 3.583 u[IU]/mL (ref 0.350–4.500)

## 2022-09-22 MED ORDER — METOPROLOL TARTRATE 25 MG PO TABS: 12.5000 mg | ORAL_TABLET | Freq: Two times a day (BID) | ORAL | 3 refills | Status: DC

## 2022-09-22 MED ORDER — METOPROLOL TARTRATE 25 MG PO TABS: 25.0000 mg | ORAL_TABLET | Freq: Two times a day (BID) | ORAL | 3 refills | Status: DC

## 2022-09-22 NOTE — Patient Instructions (Addendum)
Medication Instructions:  Your physician has recommended you make the following change in your medication:   -- Start Metoprolol Tartrate 12.5 mg twice daily  *If you need a refill on your cardiac medications before your next appointment, please call your pharmacy*   Lab Work: None If you have labs (blood work) drawn today and your tests are completely normal, you will receive your results only by: MyChart Message (if you have MyChart) OR A paper copy in the mail If you have any lab test that is abnormal or we need to change your treatment, we will call you to review the results.   Testing/Procedures: Your physician has requested that you have an echocardiogram. Echocardiography is a painless test that uses sound waves to create images of your heart. It provides your doctor with information about the size and shape of your heart and how well your heart's chambers and valves are working. This procedure takes approximately one hour. There are no restrictions for this procedure. Please do NOT wear cologne, perfume, aftershave, or lotions (deodorant is allowed). Please arrive 15 minutes prior to your appointment time.  [place] Sleep Study   Follow-Up: At [facility], you and your health needs are our priority.  As part of our continuing mission to provide you with exceptional heart care, we have created designated Provider Care Teams.  These Care Teams include your primary Cardiologist (physician) and Advanced Practice Providers (APPs -  Physician Assistants and Nurse Practitioners) who all work together to provide you with the care you need, when you need it.  We recommend signing up for the patient portal called "MyChart".  Sign up information is provided on this After Visit Summary.  MyChart is used to connect with patients for Virtual Visits (Telemedicine).  Patients are able to view lab/test results, encounter notes, upcoming appointments, etc.  Non-urgent messages can be sent to  your provider as well.   To learn more about what you can do with MyChart, go to [email or web address].    Your next appointment:   3 month(s)  Provider:   Luane School, MD    Other Instructions Christena Deem- Long Term Monitor Instructions   Your physician has requested you wear your ZIO patch monitor___14____days.   This is a single patch monitor.  [employer] supplies one patch monitor per enrollment.  Additional stickers are not available.   Please do not apply patch if you will be having a Nuclear Stress Test, Echocardiogram, Cardiac CT, MRI, or Chest Xray during the time frame you would be wearing the monitor. The patch cannot be worn during these tests.  You cannot remove and re-apply the ZIO XT patch monitor.   Your ZIO patch monitor will be sent USPS Priority mail from [facility] directly to your home address. The monitor may also be mailed to a PO BOX if home delivery is not available.   It may take 3-5 days to receive your monitor after you have been enrolled.   Once you have received you monitor, please review enclosed instructions.  Your monitor has already been registered assigning a specific monitor serial # to you.   Applying the monitor   Shave hair from upper left chest.   Hold abrader disc by orange tab.  Rub abrader in 40 strokes over left upper chest as indicated in your monitor instructions.   Clean area with 4 enclosed alcohol pads .  Use all pads to assure are is cleaned thoroughly.  Let dry.   Apply patch as  indicated in monitor instructions.  Patch will be place under collarbone on left side of chest with arrow pointing upward.   Rub patch adhesive wings for 2 minutes.Remove white label marked "1".  Remove white label marked "2".  Rub patch adhesive wings for 2 additional minutes.   While looking in a mirror, press and release button in center of patch.  A small green light will flash 3-4 times .  This will be your only indicator the monitor has been  turned on.     Do not shower for the first 24 hours.  You may shower after the first 24 hours.   Press button if you feel a symptom. You will hear a small click.  Record Date, Time and Symptom in the Patient Log Book.   When you are ready to remove patch, follow instructions on last 2 pages of Patient Log Book.  Stick patch monitor onto last page of Patient Log Book.   Place Patient Log Book in [place] box.  Use locking tab on box and tape box closed securely.  The [employer] and [employer] has [employer] on it.  Please place in mailbox as soon as possible.  Your physician should have your test results approximately 7 days after the monitor has been mailed back to [facility].   Call [facility] Customer Care at [phone] if you have questions regarding your ZIO XT patch monitor.  Call them immediately if you see an orange light blinking on your monitor.   If your monitor falls off in less than 4 days contact our Monitor department at [phone].  If your monitor becomes loose or falls off after 4 days call [employer] at [phone] for suggestions on securing your monitor.

## 2022-09-22 NOTE — Progress Notes (Signed)
Sleep Apnea Evaluation  [facility]  Today's Date: 09/22/2022   Patient Name: [name]        DOB: [birth year]       Height:  6\' 2"  (1.88 m)     Weight: 221 lb 6.4 oz (100.4 kg)  BMI: Body mass index is 28.43 kg/m.    Referring Provider:  Dr Hart Rochester Mallipeddi   STOP-BANG RISK ASSESSMENT       09/22/2022    2:24 PM  STOP-BANG  Do you snore loudly? Yes  Do you often feel tired, fatigued, or sleepy during the daytime? No  Has anyone observed you stop breathing during sleep? Yes  Do you have (or are you being treated for) high blood pressure? No  Recent BMI (Calculated) 28.41  Is BMI greater than 35 kg/m2? 0=No  Age older than 67 years old? 1=Yes  Has large neck size > 40 cm (15.7 in, large male shirt size, large male collar size > 16) No  Gender - Male 1=Yes  STOP-Bang Total Score 4      If STOP-BANG Score ?3 OR two clinical symptoms - patient qualifies for WatchPAT (CPT 95800)      Sleep study ordered due to two (2) of the following clinical symptoms/diagnoses:  Excessive daytime sleepiness G47.10  Gastroesophageal reflux K21.9  Nocturia R35.1  Morning Headaches G44.221  Difficulty concentrating R41.840  Memory problems or poor judgment G31.84  Personality changes or irritability R45.4  Loud snoring R06.83  Depression F32.9  Unrefreshed by sleep G47.8  Impotence N52.9  History of high blood pressure R03.0  Insomnia G47.00  Sleep Disordered Breathing or Sleep Apnea ICD G47.33

## 2022-09-22 NOTE — Progress Notes (Signed)
Cardiology Office Note  Date: 09/22/2022   ID: [name], DOB 1955-07-03, MRN [id number]  PCP:  Aliene Beams, MD  Cardiologist:  Marjo Bicker, MD Electrophysiologist:  None   Reason for Office Visit: Preop cardiac risk stratification for HoLEP procedure and evaluation of atrial fibrillation.   History of Present Illness: [name] is a 67 y.o. male with no PMH was referred to cardiology clinic for evaluation of atrial fibrillation and preop cardiac risk stratification.  Patient has severe BPH (failed voiding trial, currently on Foley catheter) for which he wants to undergo HoLEP procedure. Follows up with urology. He was started on finasteride and tamsulosin in the first week of April 2024 after which he had a syncopal episode at his house in the middle of the night. He had some dizziness followed by loss of consciousness. He hit his head and had a swelling. It had to be stapled. Since then, he started to get up from sitting to standing position slowly as he learned that dizziness was a side effect of tamsulosin and finasteride. He denies any angina or DOE. No palpitations, fatigue, leg swelling. His brother was diagnosed with atrial fibrillation and his mother had vascular dementia, had mini strokes at the age of 67 years old.  His CHA2DS2-VASc score is 1. He was never diagnosed with OSA in the past. Wife tells me that he snores and also she noticed him stop breathing in his sleep.  Upon chart review, his TSH was borderline elevated.  Past Medical History:  Diagnosis Date   A-fib [facility])     Past Surgical History:  Procedure Laterality Date   BACK SURGERY     COLONOSCOPY  06/23/2011   Procedure: COLONOSCOPY;  Surgeon: Malissa Hippo, MD;  [facility];  Service: Endoscopy;  Laterality: N/A;  3:00   COLONOSCOPY N/A 07/15/2016   Procedure: COLONOSCOPY;  Surgeon: Malissa Hippo, MD;  [facility];  Service: Endoscopy;  Laterality: N/A;  830    COLONOSCOPY WITH PROPOFOL N/A 08/27/2021   Procedure: COLONOSCOPY WITH PROPOFOL;  Surgeon: Malissa Hippo, MD;  [facility];  Service: Endoscopy;  Laterality: N/A;  730    Current Outpatient Medications  Medication Sig Dispense Refill   finasteride (PROSCAR) 5 MG tablet Take 1 tablet (5 mg total) by mouth daily. 30 tablet 11   Multiple Vitamin (MULTIVITAMIN) tablet Take 1 tablet by mouth daily.      ondansetron (ZOFRAN-ODT) 8 MG disintegrating tablet Take 1 tablet (8 mg total) by mouth every 8 (eight) hours as needed for nausea. 20 tablet 0   oxyCODONE (OXY IR/ROXICODONE) 5 MG immediate release tablet Take 5 mg by mouth every 4 (four) hours as needed.     sildenafil (VIAGRA) 100 MG tablet 1/2 to 1 tab po prn 30 tablet 99   tamsulosin (FLOMAX) 0.4 MG CAPS capsule Take 1 capsule (0.4 mg total) by mouth daily. Until stone passes 30 capsule 0   tamsulosin (FLOMAX) 0.4 MG CAPS capsule Take 1 capsule (0.4 mg total) by mouth daily after supper. 90 capsule 11   HYDROcodone-acetaminophen (NORCO/VICODIN) 5-325 MG tablet Take 1 tablet by mouth every 8 (eight) hours as needed. (Patient not taking: Reported on 09/22/2022) 8 tablet 0   No current facility-administered medications for this visit.   Allergies:  Patient has no known allergies.   Social History: The patient  reports that he has never smoked. He has never used smokeless tobacco. He reports that he  does not drink alcohol and does not use drugs.   Family History: The patient's family history includes Cancer in his father.   ROS:  Please see the history of present illness. Otherwise, complete review of systems is positive for none.  All other systems are reviewed and negative.   Physical Exam: VS:  BP 134/87 (BP Location: Left Arm, Patient Position: Sitting, Cuff Size: Normal)   Pulse 85   Ht 6\' 2"  (1.88 m)   Wt 221 lb 6.4 oz (100.4 kg)   SpO2 98%   BMI 28.43 kg/m , BMI Body mass index is 28.43 kg/m.  Wt Readings from Last 3  Encounters:  09/22/22 221 lb 6.4 oz (100.4 kg)  09/09/22 215 lb (97.5 kg)  08/25/22 218 lb 4.1 oz (99 kg)    General: Patient appears comfortable at rest. HEENT: Conjunctiva and lids normal, oropharynx clear with moist mucosa. Neck: Supple, no elevated JVP or carotid bruits, no thyromegaly. Lungs: Clear to auscultation, nonlabored breathing at rest. Cardiac: Regular rate and rhythm, no S3 or significant systolic murmur, no pericardial rub. Abdomen: Soft, nontender, no hepatomegaly, bowel sounds present, no guarding or rebound. Extremities: No pitting edema, distal pulses 2+. Skin: Warm and dry. Musculoskeletal: No kyphosis. Neuropsychiatric: Alert and oriented x3, affect grossly appropriate.  Recent Labwork: 08/25/2022: Magnesium 2.1 09/09/2022: ALT 20; AST 17; BUN 17; Creatinine, Ser 1.19; Hemoglobin 14.2; Platelets 258; Potassium 3.7; Sodium 137  No results found for: "CHOL", "TRIG", "HDL", "CHOLHDL", "VLDL", "LDLCALC", "LDLDIRECT"  Other Studies Reviewed Today:   Assessment and Plan: Patient is a 67 year old M with no PMH was referred to cardiology clinic for evaluation of atrial fibrillation and preop cardiac risk stratification for Holter procedure.  # Paroxysmal atrial fibrillation -I reviewed the EKGs on the EMR which showed atrial fibrillation with mild elevation of heart rates, HR 90-100s. EKG today showed NSR.  He is currently not on any rate control agents. Will start on metoprolol tartrate 12.5 mg twice daily. His CHA2DS2-VASc score is 1. He has a strong family history, his brother had A-fib and mother had history of strokes but no evidence of arrhythmia. Will start him on systemic anticoagulation after his surgeries.  Obtain 2D echocardiogram, TSH and home sleep study.  STOP-BANG score is 4.  # Dizziness and syncope -I believe his symptoms of dizziness and syncope in the first week of April are likely secondary to medications, finasteride and tamsulosin. It is okay to start  metoprolol tartrate 12.5 mg twice daily for atrial fibrillation.  EKG today showed NSR, HR 70s. I will also obtain 2-week event monitor, live to rule out any bradycardia arrhythmias which I think is still unlikely.  # Preop cardiac risk stratification for HoLEP procedure and other surgeries under general anesthesia -Patient denied any angina or DOE. EKG today showed NSR. Patient is at a low risk for any perioperative cardiac complications for a low to intermediate risk procedure, HoLEP based on his comorbidities (which are none except for paroxysmal A-fib). I am obtaining 2D echocardiogram to update his LVEF but this can be performed after surgery. I will also start him on systemic anticoagulation after his surgeries.   I have spent a total of 45 minutes with patient reviewing chart, EKGs, labs and examining patient as well as establishing an assessment and plan that was discussed with the patient.  > 50% of time was spent in direct patient care.    Medication Adjustments/Labs and Tests Ordered: Current medicines are reviewed at length with the patient  today.  Concerns regarding medicines are outlined above.   Tests Ordered: Orders Placed This Encounter  Procedures   ECHOCARDIOGRAM COMPLETE    Medication Changes: No orders of the defined types were placed in this encounter.   Disposition:  Follow up  3 months  Signed, Jakaylee Sasaki Verne Spurr, MD, 09/22/2022 2:30 PM    [facility] at [facility] [address], [place], [place] [address]

## 2022-09-23 DIAGNOSIS — R55 Syncope and collapse: Secondary | ICD-10-CM | POA: Diagnosis not present

## 2022-09-27 ENCOUNTER — Telehealth: Payer: Self-pay | Admitting: *Deleted

## 2022-09-27 NOTE — Telephone Encounter (Signed)
Prior Authorization for ITAMAR sent to [employer] via web portal. Tracking Number .  READY- NO PA REQ 

## 2022-09-28 ENCOUNTER — Encounter: Payer: Self-pay | Admitting: Urology

## 2022-09-28 ENCOUNTER — Ambulatory Visit: Payer: Medicare Other | Admitting: Urology

## 2022-09-28 VITALS — BP 124/79 | HR 67 | Ht 74.0 in | Wt 223.0 lb

## 2022-09-28 DIAGNOSIS — N138 Other obstructive and reflux uropathy: Secondary | ICD-10-CM | POA: Diagnosis not present

## 2022-09-28 DIAGNOSIS — N201 Calculus of ureter: Secondary | ICD-10-CM

## 2022-09-28 DIAGNOSIS — R972 Elevated prostate specific antigen [PSA]: Secondary | ICD-10-CM | POA: Diagnosis not present

## 2022-09-28 DIAGNOSIS — N401 Enlarged prostate with lower urinary tract symptoms: Secondary | ICD-10-CM | POA: Diagnosis not present

## 2022-09-28 DIAGNOSIS — N200 Calculus of kidney: Secondary | ICD-10-CM

## 2022-09-28 NOTE — Telephone Encounter (Signed)
Patient notified via My Chart

## 2022-09-28 NOTE — H&P (View-Only) (Signed)
 09/28/22 1:42 PM   Zeric G Cragg 05/04/1956 [id number]  CC: BPH and retention, left distal ureteral stone, bladder stones, elevated PSA  HPI: 67-year-old male with BPH and urinary retention referred from Dr. Dahlstedt for consideration of HOLEP.  Prostate measures 160g on recent CT.  He reports a long history of obstructive urinary symptoms, ultimately developed urinary retention after anesthesia for recent foot procedure, and failed a voiding trial afterwards with Dr. Dahlstedt in [place].  He had an adverse reaction to Flomax with syncopal episode.  PSA has ranged from 5.5-7 over the last few years, history of a negative prostate biopsy in 2021.  PSA density very low.  Recent CT for left-sided flank pain on 09/09/2022 showed a 5 mm left distal ureteral stone, multiple small bladder stones, 160 g prostate, and Foley in place.  He thinks he passed his stone as he has not had any further left-sided flank pain.   PMH: Past Medical History:  Diagnosis Date   A-fib (HCC)     Surgical History: Past Surgical History:  Procedure Laterality Date   BACK SURGERY     COLONOSCOPY  06/23/2011   Procedure: COLONOSCOPY;  Surgeon: [name] [name], MD;  [facility];  Service: Endoscopy;  Laterality: N/A;  3:00   COLONOSCOPY N/A 07/15/2016   Procedure: COLONOSCOPY;  Surgeon: [name] [name], MD;  [facility];  Service: Endoscopy;  Laterality: N/A;  830   COLONOSCOPY WITH PROPOFOL N/A 08/27/2021   Procedure: COLONOSCOPY WITH PROPOFOL;  Surgeon: [name], [name], MD;  [facility];  Service: Endoscopy;  Laterality: N/A;  730    Home Medications:  Family History: Family History  Problem Relation Age of Onset   Cancer Father     Social History:  reports that he has never smoked. He has never been exposed to tobacco smoke. He has never used smokeless tobacco. He reports that he does not drink alcohol and does not use drugs.  Physical Exam: BP 124/79   Pulse 67    Ht 6' 2" (1.88 m)   Wt 223 lb (101.2 kg)   BMI 28.63 kg/m    Constitutional:  Alert and oriented, No acute distress. Cardiovascular: No clubbing, cyanosis, or edema. Respiratory: Normal respiratory effort, no increased work of breathing. GI: Abdomen is soft, nontender, nondistended, no abdominal masses    Pertinent Imaging: I have personally viewed and interpreted the CT stone protocol dated 09/09/2022 showing a 5 mm left distal ureteral stone, multiple small bladder stones, 160 g prostate, and Foley in place. .  Assessment & Plan:   67-year-old male with history of obstructive urinary symptoms culminating in urinary retention, did not tolerate Flomax and failed voiding trial a few weeks later, prostate measures 160 g on recent CT, history of negative prostate biopsy in 2021 and PSA density very low and reassuring.  Also recently seen in ER for a 5 mm left distal ureteral stone, symptoms have resolved and he thinks he passed the stone.  We discussed the risks and benefits of HoLEP at length.  The procedure requires general anesthesia and takes 1 to 2 hours, and a holmium laser is used to enucleate the prostate and push this tissue into the bladder.  A morcellator is then used to remove this tissue, which is sent for pathology.  The vast majority(>95%) of patients are able to discharge the same day with a catheter in place for 2 to 3 days, and will follow-up in clinic for a   voiding trial.  We specifically discussed the risks of bleeding, infection, retrograde ejaculation, temporary urgency and urge incontinence, very low risk of long-term incontinence, urethral stricture/bladder neck contracture, pathologic evaluation of prostate tissue and possible detection of prostate cancer or other malignancy, and possible need for additional procedures.  Schedule cystolitholapaxy and HOLEP-if recurrent flank pain consider left retrograde pyelogram/left ureteroscopy at time of surgery Has appointment with  Dr. Dahlstedt 10/13/2022 for Foley change, we can send urine culture from that visit  Kou Gucciardo, MD 09/28/2022  [facility] [address], [address] [place], [place] [address] [phone]   

## 2022-09-28 NOTE — Patient Instructions (Signed)

## 2022-09-28 NOTE — Progress Notes (Signed)
09/28/22 1:42 PM   [name] [birth year] [id number]  CC: BPH and retention, left distal ureteral stone, bladder stones, elevated PSA  HPI: 67 year old male with BPH and urinary retention referred from Dr. Retta Diones for consideration of HOLEP.  Prostate measures 160g on recent CT.  He reports a long history of obstructive urinary symptoms, ultimately developed urinary retention after anesthesia for recent foot procedure, and failed a voiding trial afterwards with Dr. Retta Diones in [place].  He had an adverse reaction to Flomax with syncopal episode.  PSA has ranged from 5.5-7 over the last few years, history of a negative prostate biopsy in 2021.  PSA density very low.  Recent CT for left-sided flank pain on 09/09/2022 showed a 5 mm left distal ureteral stone, multiple small bladder stones, 160 g prostate, and Foley in place.  He thinks he passed his stone as he has not had any further left-sided flank pain.   PMH: Past Medical History:  Diagnosis Date   A-fib [facility])     Surgical History: Past Surgical History:  Procedure Laterality Date   BACK SURGERY     COLONOSCOPY  06/23/2011   Procedure: COLONOSCOPY;  Surgeon: Malissa Hippo, MD;  [facility];  Service: Endoscopy;  Laterality: N/A;  3:00   COLONOSCOPY N/A 07/15/2016   Procedure: COLONOSCOPY;  Surgeon: Malissa Hippo, MD;  [facility];  Service: Endoscopy;  Laterality: N/A;  830   COLONOSCOPY WITH PROPOFOL N/A 08/27/2021   Procedure: COLONOSCOPY WITH PROPOFOL;  Surgeon: Malissa Hippo, MD;  [facility];  Service: Endoscopy;  Laterality: N/A;  730    Home Medications:  Family History: Family History  Problem Relation Age of Onset   Cancer Father     Social History:  reports that he has never smoked. He has never been exposed to tobacco smoke. He has never used smokeless tobacco. He reports that he does not drink alcohol and does not use drugs.  Physical Exam: BP 124/79   Pulse 67    Ht 6\' 2"  (1.88 m)   Wt 223 lb (101.2 kg)   BMI 28.63 kg/m    Constitutional:  Alert and oriented, No acute distress. Cardiovascular: No clubbing, cyanosis, or edema. Respiratory: Normal respiratory effort, no increased work of breathing. GI: Abdomen is soft, nontender, nondistended, no abdominal masses    Pertinent Imaging: I have personally viewed and interpreted the CT stone protocol dated 09/09/2022 showing a 5 mm left distal ureteral stone, multiple small bladder stones, 160 g prostate, and Foley in place. .  Assessment & Plan:   67 year old male with history of obstructive urinary symptoms culminating in urinary retention, did not tolerate Flomax and failed voiding trial a few weeks later, prostate measures 160 g on recent CT, history of negative prostate biopsy in 2021 and PSA density very low and reassuring.  Also recently seen in ER for a 5 mm left distal ureteral stone, symptoms have resolved and he thinks he passed the stone.  We discussed the risks and benefits of HoLEP at length.  The procedure requires general anesthesia and takes 1 to 2 hours, and a holmium laser is used to enucleate the prostate and push this tissue into the bladder.  A morcellator is then used to remove this tissue, which is sent for pathology.  The vast majority(>95%) of patients are able to discharge the same day with a catheter in place for 2 to 3 days, and will follow-up in clinic for a  voiding trial.  We specifically discussed the risks of bleeding, infection, retrograde ejaculation, temporary urgency and urge incontinence, very low risk of long-term incontinence, urethral stricture/bladder neck contracture, pathologic evaluation of prostate tissue and possible detection of prostate cancer or other malignancy, and possible need for additional procedures.  Schedule cystolitholapaxy and HOLEP-if recurrent flank pain consider left retrograde pyelogram/left ureteroscopy at time of surgery Has appointment with  Dr. Retta Diones 10/13/2022 for Foley change, we can send urine culture from that visit  Legrand Rams, MD 09/28/2022  [facility] [address] [place], [place] [address] [phone]

## 2022-09-29 DIAGNOSIS — H5203 Hypermetropia, bilateral: Secondary | ICD-10-CM | POA: Diagnosis not present

## 2022-09-29 DIAGNOSIS — K080 Exfoliation of teeth due to systemic causes: Secondary | ICD-10-CM | POA: Diagnosis not present

## 2022-10-01 ENCOUNTER — Encounter (INDEPENDENT_AMBULATORY_CARE_PROVIDER_SITE_OTHER): Payer: Medicare Other | Admitting: Cardiology

## 2022-10-01 DIAGNOSIS — G4733 Obstructive sleep apnea (adult) (pediatric): Secondary | ICD-10-CM

## 2022-10-01 HISTORY — DX: Obstructive sleep apnea (adult) (pediatric): G47.33

## 2022-10-04 ENCOUNTER — Telehealth: Payer: Self-pay | Admitting: Internal Medicine

## 2022-10-04 ENCOUNTER — Encounter: Payer: Self-pay | Admitting: Urology

## 2022-10-04 ENCOUNTER — Ambulatory Visit: Payer: Medicare Other | Attending: Internal Medicine

## 2022-10-04 ENCOUNTER — Other Ambulatory Visit: Payer: Self-pay | Admitting: Urology

## 2022-10-04 DIAGNOSIS — N210 Calculus in bladder: Secondary | ICD-10-CM

## 2022-10-04 DIAGNOSIS — I4891 Unspecified atrial fibrillation: Secondary | ICD-10-CM

## 2022-10-04 DIAGNOSIS — D480 Neoplasm of uncertain behavior of bone and articular cartilage: Secondary | ICD-10-CM | POA: Diagnosis not present

## 2022-10-04 DIAGNOSIS — N401 Enlarged prostate with lower urinary tract symptoms: Secondary | ICD-10-CM

## 2022-10-04 DIAGNOSIS — M85579 Aneurysmal bone cyst, unspecified ankle and foot: Secondary | ICD-10-CM | POA: Diagnosis not present

## 2022-10-04 DIAGNOSIS — R55 Syncope and collapse: Secondary | ICD-10-CM

## 2022-10-04 NOTE — Telephone Encounter (Signed)
Abnormal results for patient. Please advise

## 2022-10-04 NOTE — Progress Notes (Signed)
Surgical Physician Order Form [facility]  * Scheduling expectation : Next Available  *Length of Case: 2 hours  *Clearance needed: no  *Anticoagulation Instructions: Hold all anticoagulants  *Aspirin Instructions: Hold Aspirin and Plavix  *Post-op visit Date/Instructions:  1-3 day cath removal  *Diagnosis: BPH w/BOO, bladder stones  *Procedure:  HOLEP ([address]), and cystolitholapaxy <2cm   Additional orders: N/A  -Admit type: OUTpatient  -Anesthesia: General  -VTE Prophylaxis Standing Order SCD's       Other:   -Standing Lab Orders Per Anesthesia    Lab other: UA&Urine Culture  -Standing Test orders EKG/Chest x-ray per Anesthesia       Test other:   - Medications:  Ancef 2gm IV  -Other orders:  N/A

## 2022-10-04 NOTE — Telephone Encounter (Signed)
Brianna from [employer] called to say monitor had auto detect rhythm of a-fib for 90 seconds with HR 109-155, with average HR of 126

## 2022-10-04 NOTE — Procedures (Signed)
SLEEP STUDY REPORT Patient Information Study Date: 10/01/2022 Patient Name: [name] Patient ID: [id number] Birth Date: 1955/09/23 Age: 67 Gender: Male BMI: 28.3 (W=220 lb, H=6' 2'') Stopbang: 4 Referring Physician: Luane School, MD  TEST DESCRIPTION:  Home sleep apnea testing was completed using the WatchPat, a Type 1 device, utilizing peripheral arterial tonometry (PAT), chest movement, actigraphy, pulse oximetry, pulse rate, body position and snore.  AHI was calculated with apnea and hypopnea using valid sleep time as the denominator. RDI includes apneas, hypopneas, and RERAs.  The data acquired and the scoring of sleep and all associated events were performed in accordance with the recommended standards and specifications as outlined in the AASM Manual for the Scoring of Sleep and Associated Events 2.2.0 (2015).  FINDINGS:  1.  Moderate Obstructive Sleep Apnea with AHI 15.7/hr.   2.  No Central Sleep Apnea with pAHIc 1.6/hr.  3.  Oxygen desaturations as low as 85%.  4.  Mild to moderate snoring was present. O2 sats were < 88% for 0.9 min.  5.  Total sleep time was 7 hrs and 42 min.  6.  17.4% of total sleep time was spent in REM sleep.   7.  Normal sleep onset latency at 16 min  8.  Shortened REM sleep onset latency at 47 min.   9.  Total awakenings were 9.  10. Arrhythmia detection:  None  DIAGNOSIS:   Moderate Obstructive Sleep Apnea (G47.33)  RECOMMENDATIONS:   1.  Clinical correlation of these findings is necessary.  The decision to treat obstructive sleep apnea (OSA) is usually based on the presence of apnea symptoms or the presence of associated medical conditions such as Hypertension, Congestive Heart Failure, Atrial Fibrillation or Obesity.  The most common symptoms of OSA are snoring, gasping for breath while sleeping, daytime sleepiness and fatigue.   2.  Initiating apnea therapy is recommended given the presence of symptoms and/or associated conditions.  Recommend proceeding with one of the following:     a.  Auto-CPAP therapy with a pressure range of 5-20cm H2O.     b.  An oral appliance (OA) that can be obtained from certain dentists with expertise in sleep medicine.  These are primarily of use in non-obese patients with mild and moderate disease.     c.  An ENT consultation which may be useful to look for specific causes of obstruction and possible treatment options.     d.  If patient is intolerant to PAP therapy, consider referral to ENT for evaluation for hypoglossal nerve stimulator.   3.  Close follow-up is necessary to ensure success with CPAP or oral appliance therapy for maximum benefit.  4.  A follow-up oximetry study on CPAP is recommended to assess the adequacy of therapy and determine the need for supplemental oxygen or the potential need for Bi-level therapy.  An arterial blood gas to determine the adequacy of baseline ventilation and oxygenation should also be considered.  5.  Healthy sleep recommendations include:  adequate nightly sleep (normal 7-9 hrs/night), avoidance of caffeine after noon and alcohol near bedtime, and maintaining a sleep environment that is cool, dark and quiet.  6.  Weight loss for overweight patients is recommended.  Even modest amounts of weight loss can significantly improve the severity of sleep apnea.  7.  Snoring recommendations include:  weight loss where appropriate, side sleeping, and avoidance of alcohol before bed.  8.  Operation of motor vehicle should not be performed when sleepy.  Signature: Tayonna Bacha  Mayford Knife, MD; [facility]; Diplomat, American Board of Sleep Medicine Electronically Signed: 10/04/2022 9:12:25 PM

## 2022-10-05 ENCOUNTER — Telehealth: Payer: Self-pay

## 2022-10-05 DIAGNOSIS — H6123 Impacted cerumen, bilateral: Secondary | ICD-10-CM | POA: Diagnosis not present

## 2022-10-05 NOTE — Telephone Encounter (Signed)
I spoke with [name]. We have discussed possible surgery dates and Friday May 31st, 2024 was agreed upon by all parties. Patient given information about surgery date, what to expect pre-operatively and post operatively.  We discussed that a Pre-Admission Testing office will be calling to set up the pre-op visit that will take place prior to surgery, and that these appointments are typically done over the phone with a Pre-Admissions RN. Informed patient that our office will communicate any additional care to be provided after surgery. Patients questions or concerns were discussed during our call. Advised to call our office should there be any additional information, questions or concerns that arise. Patient verbalized understanding.

## 2022-10-05 NOTE — Progress Notes (Signed)
   [facility] Posting Form  Surgery Date: Date: 10/22/2022  Surgeon: Dr. 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 Hillis Range  *Placed into Prior Authorization Work Angela Nevin Date: 10/05/22  Assistant/laser/rep:No

## 2022-10-11 ENCOUNTER — Encounter
Admission: RE | Admit: 2022-10-11 | Discharge: 2022-10-11 | Disposition: A | Discharge status: Home / Self Care | Payer: Medicare Other | Source: Ambulatory Visit | Attending: Urology | Admitting: Urology

## 2022-10-11 HISTORY — DX: Sleep apnea, unspecified: G47.30

## 2022-10-11 HISTORY — DX: Benign prostatic hyperplasia with lower urinary tract symptoms: N13.8

## 2022-10-11 HISTORY — DX: Calculus in bladder: N21.0

## 2022-10-11 HISTORY — DX: Hemangioma of intra-abdominal structures: D18.03

## 2022-10-11 HISTORY — DX: Disorder of the skin and subcutaneous tissue, unspecified: L98.9

## 2022-10-11 HISTORY — DX: Other obstructive and reflux uropathy: N40.1

## 2022-10-11 HISTORY — DX: Personal history of other diseases of the digestive system: Z87.19

## 2022-10-11 HISTORY — DX: Other complications of anesthesia, initial encounter: T88.59XA

## 2022-10-11 HISTORY — DX: Personal history of urinary calculi: Z87.442

## 2022-10-11 NOTE — Patient Instructions (Addendum)
Your procedure is scheduled on:10-22-22 Friday Report to the Registration Desk on the 1st floor of the [facility].Then proceed to the 2nd floor Surgery Desk To find out your arrival time, please call [phone] between 1PM - 3PM on:10-20-22 Thursday If your arrival time is 6:00 am, do not arrive before that time as the [facility] entrance doors do not open until 6:00 am.  REMEMBER: Instructions that are not followed completely may result in serious medical risk, up to and including death; or upon the discretion of your surgeon and anesthesiologist your surgery may need to be rescheduled.  Do not eat food OR drink any liquids after midnight the night before surgery.  No gum chewing or hard candies.  One week prior to surgery:Last dose will be on 10-13-32 Stop Anti-inflammatories (NSAIDS) such as Advil, Aleve, Ibuprofen, Motrin, Naproxen, Naprosyn and Aspirin based products such as Excedrin, Goody's Powder, BC Powder.You may however, take Tylenol/Oxycodone if needed for pain up until the day of surgery. Stop ANY OVER THE COUNTER supplements/vitamins 7 days prior (Multivitamin)  Stop your sildenafil (VIAGRA) 2 days prior to surgery-Last dose will be on 10-19-22 Tuesday  TAKE ONLY THESE MEDICATIONS THE MORNING OF SURGERY WITH A SIP OF WATER: -finasteride (PROSCAR)  -metoprolol tartrate (LOPRESSOR)  -tamsulosin (FLOMAX)   No Alcohol for 24 hours before or after surgery.  No Smoking including e-cigarettes for 24 hours before surgery.  No chewable tobacco products for at least 6 hours before surgery.  No nicotine patches on the day of surgery.  Do not use any "recreational" drugs for at least a week (preferably 2 weeks) before your surgery.  Please be advised that the combination of cocaine and anesthesia may have negative outcomes, up to and including death. If you test positive for cocaine, your surgery will be cancelled.  On the morning of surgery brush your teeth with toothpaste and  water, you may rinse your mouth with mouthwash if you wish. Do not swallow any toothpaste or mouthwash  Do not wear jewelry, make-up, hairpins, clips or nail polish.  Do not wear lotions, powders, or perfumes.   Do not shave body hair from the neck down 48 hours before surgery.  Contact lenses, hearing aids and dentures may not be worn into surgery.  Do not bring valuables to the hospital. [facility] is not responsible for any missing/lost belongings or valuables.   Notify your doctor if there is any change in your medical condition (cold, fever, infection).  Wear comfortable clothing (specific to your surgery type) to the hospital.  After surgery, you can help prevent lung complications by doing breathing exercises.  Take deep breaths and cough every 1-2 hours. Your doctor may order a device called an Incentive Spirometer to help you take deep breaths. When coughing or sneezing, hold a pillow firmly against your incision with both hands. This is called "splinting." Doing this helps protect your incision. It also decreases belly discomfort.  If you are being admitted to the hospital overnight, leave your suitcase in the car. After surgery it may be brought to your room.  In case of increased patient census, it may be necessary for you, the patient, to continue your postoperative care in the Same Day Surgery department.  If you are being discharged the day of surgery, you will not be allowed to drive home. You will need a responsible individual to drive you home and stay with you for 24 hours after surgery.   If you are taking public transportation,  you will need to have a responsible individual with you.  Please call the Pre-admissions Testing Dept. at [phone] if you have any questions about these instructions.  Surgery Visitation Policy:  Patients having surgery or a procedure may have two visitors.  Children under the age of 43 must have an adult with them who is not the  patient.

## 2022-10-12 ENCOUNTER — Encounter: Payer: Self-pay | Admitting: Urology

## 2022-10-12 ENCOUNTER — Ambulatory Visit: Payer: Medicare Other | Admitting: Urology

## 2022-10-12 VITALS — BP 128/79 | HR 68

## 2022-10-12 DIAGNOSIS — N138 Other obstructive and reflux uropathy: Secondary | ICD-10-CM

## 2022-10-12 DIAGNOSIS — Z87442 Personal history of urinary calculi: Secondary | ICD-10-CM | POA: Diagnosis not present

## 2022-10-12 DIAGNOSIS — R972 Elevated prostate specific antigen [PSA]: Secondary | ICD-10-CM

## 2022-10-12 DIAGNOSIS — N401 Enlarged prostate with lower urinary tract symptoms: Secondary | ICD-10-CM

## 2022-10-12 DIAGNOSIS — R339 Retention of urine, unspecified: Secondary | ICD-10-CM | POA: Diagnosis not present

## 2022-10-12 DIAGNOSIS — N210 Calculus in bladder: Secondary | ICD-10-CM

## 2022-10-12 MED ORDER — CEPHALEXIN 500 MG PO CAPS
500.0000 mg | ORAL_CAPSULE | Freq: Two times a day (BID) | ORAL | 0 refills | Status: AC
Start: 2022-10-12 — End: 2022-10-15

## 2022-10-12 NOTE — Progress Notes (Signed)
History of Present Illness: [name] is scheduled for HoLEP procedure on the 31st of this month.  Here for catheter change and preop urine culture.  Is having some blood from around his catheter that happens twice a week.  He has been put on OAB medicine to help with bladder spasms.  Past Medical History:  Diagnosis Date   A-fib [facility])    Bladder stones    BPH with urinary obstruction    Complication of anesthesia    urinary retention after 07-2022 foot surgery at [facility] requiring ed visit and foley x 2 months   Foot lesion    History of kidney stones    Liver hemangioma    large   S/P dilatation of esophageal stricture    Sleep apnea    just had sleep study completed 10-01-22 moderate sleep apnea-does not have cpap as of 10-11-22   Syncope and collapse 08/2022   after starting flomax and finasteride hitting head which needed staples-cardiologist aware    Past Surgical History:  Procedure Laterality Date   BACK SURGERY  2003   lumbar-ruptured disc   COLONOSCOPY  06/23/2011   Procedure: COLONOSCOPY;  Surgeon: Malissa Hippo, MD;  Location: [facility];  Service: Endoscopy;  Laterality: N/A;  3:00   COLONOSCOPY N/A 07/15/2016   Procedure: COLONOSCOPY;  Surgeon: Malissa Hippo, MD;  Location: [facility];  Service: Endoscopy;  Laterality: N/A;  830   COLONOSCOPY WITH PROPOFOL N/A 08/27/2021   Procedure: COLONOSCOPY WITH PROPOFOL;  Surgeon: Malissa Hippo, MD;  Location: [facility];  Service: Endoscopy;  Laterality: N/A;  730   ESOPHAGOGASTRODUODENOSCOPY     left lytic foot lesion, left cuboid Left 07/2022   [facility]    Home Medications:  Allergies as of 10/12/2022   No Known Allergies      Medication List        Accurate as of Oct 12, 2022  8:14 AM. If you have any questions, ask your nurse or doctor.          finasteride 5 MG tablet Commonly known as: PROSCAR Take 1 tablet (5 mg total) by mouth daily. What changed: when to take this   metoprolol tartrate 25 MG  tablet Commonly known as: LOPRESSOR Take 0.5 tablets (12.5 mg total) by mouth 2 (two) times daily.   multivitamin tablet Take 1 tablet by mouth daily.   ondansetron 8 MG disintegrating tablet Commonly known as: ZOFRAN-ODT Take 1 tablet (8 mg total) by mouth every 8 (eight) hours as needed for nausea.   oxyCODONE 5 MG immediate release tablet Commonly known as: Oxy IR/ROXICODONE Take 5 mg by mouth every 4 (four) hours as needed.   sildenafil 100 MG tablet Commonly known as: VIAGRA 1/2 to 1 tab po prn What changed:  how much to take how to take this when to take this reasons to take this   tamsulosin 0.4 MG Caps capsule Commonly known as: FLOMAX Take 1 capsule (0.4 mg total) by mouth daily after supper. What changed: when to take this        Allergies: No Known Allergies  Family History  Problem Relation Age of Onset   Cancer Father     Social History:  reports that he has never smoked. He has never been exposed to tobacco smoke. He has never used smokeless tobacco. He reports that he does not drink alcohol and does not use drugs.  ROS: A complete review of systems was performed.  All systems are  negative except for pertinent findings as noted.  Physical Exam:  Vital signs in last 24 hours: There were no vitals taken for this visit. Constitutional:  Alert and oriented, No acute distress Cardiovascular: Regular rate  Respiratory: Normal respiratory effort Neurologic: Grossly intact, no focal deficits Psychiatric: Normal mood and affect  I have reviewed prior pt notes  I have reviewed notes from referring/previous physicians  I have independently reviewed prior imaging  I have reviewed prior PSA results    Impression/Assessment:  BPH with retention, scheduled for HoLEP  Plan:  Catheter was changed today  Washings from bladder sent for culture  He was given Keflex 500 mg twice daily #6 to start 3 days before his procedure but we will notify him of  culture results and if any other antibiotic should be administered

## 2022-10-13 ENCOUNTER — Encounter: Payer: Self-pay | Admitting: Urology

## 2022-10-13 ENCOUNTER — Ambulatory Visit: Payer: Medicare Other | Admitting: Internal Medicine

## 2022-10-13 NOTE — Progress Notes (Signed)
Perioperative / Anesthesia Services  Pre-Admission Testing Clinical Review / Preoperative Anesthesia Consult  Date: 10/14/22  Patient Demographics:  Name: [name] DOB:   1956/04/07 MRN:   [id number]  Planned Surgical Procedure(s):    Case: [id number] Date/Time: 10/22/22 0942   Procedures:      HOLEP-LASER ENUCLEATION OF THE PROSTATE WITH MORCELLATION     CYSTOSCOPY WITH LITHOLAPAXY   Anesthesia type: General   Pre-op diagnosis: Benign Prostatic Hyperplasia with Urinary Obstruction, Bladder Stone   Location: [facility] OR ROOM 10 / [facility]   Surgeons: Sondra Come, MD     NOTE: Available PAT nursing documentation and vital signs have been reviewed. Clinical nursing staff has updated patient's PMH/PSHx, current medication list, and drug allergies/intolerances to ensure comprehensive history available to assist in medical decision making as it pertains to the aforementioned surgical procedure and anticipated anesthetic course. Extensive review of available clinical information personally performed. [name] PMH and PSHx updated with any diagnoses/procedures that  may have been inadvertently omitted during his intake with the pre-admission testing department's nursing staff.  Clinical Discussion:  [name] is a 67 y.o. male who is submitted for pre-surgical anesthesia review and clearance prior to him undergoing the above procedure. Patient has never been a smoker. Pertinent PMH includes: atrial fibrillation, syncope, aortic atherosclerosis, esophageal stricture (s/p dilation), OSAH (new diagnosis; no nocturnal PAP therapy yet), bladder stones, BPH, erectile dysfunction (on PDE5i).  Patient is followed by cardiology Jenene Slicker, MD). He was last seen in the cardiology clinic on 09/22/2022; notes reviewed. At the time of his clinic visit, patient reported to have been experiencing episodes of vertiginous symptoms.  He actually experienced a syncopal episode  when changing from sitting to standing position.  Changes associated with medications being used to treat his BPH (tamsulosin + finasteride).  Patient denied any episodes of chest pain, shortness breath, PND, orthopnea, palpitations, or significant peripheral edema.  Patient was reporting fatigue and daytime sleepiness.  He had recently undergone PSG testing.  Patient with a past medical history significant for cardiovascular diagnoses.  Documented physical exam was grossly benign, providing no evidence of acute exacerbation and/or decompensation of the patient's known cardiovascular conditions.  Most recent TTE on file for review was performed on 05/24/2011.  Study revealed a normal left ventricular systolic function with an EF of 55-60%.  There were no regional wall motion abnormalities.  Right ventricular size and function normal.  There was mildly calcified aortic valve annulus.  No significant valvular regurgitation noted.  All transvalvular gradients were noted to be normal providing no evidence suggestive of valvular stenosis.  PSG performed on 10/01/2022 revealed moderate obstructive sleep apnea.  AHI was 15.7 apnea events per hour.  Patient currently awaiting DME (CPAP) in order to begin treatment with nocturnal PAP therapy.  Patient with an atrial fibrillation diagnosis; CHA2DS2-VASc Score = 2 (age, vascular disease history). Patient reporting episodes of tachycardia and has a (+) familial history of atrial fibrillation.  Heart rates are running in the 90-100s bpm range.  Patient started on low-dose metoprolol tartrate 12.5 mg twice daily.  Patient not currently taking any type of oral anticoagulation therapy.  Cardiology discussed plans for patient to start on daily DOAC following upcoming surgery.  Blood pressure reasonably controlled at 134/87 mmHg without the use of pharmacological intervention..  Patient is not on lipid-lowering therapy for ASCVD prevention.  He is not diabetic.  Again, patient  is waiting for delivery of DME in order to  begin treatment for recently diagnosed OSAH syndrome. Functional capacity, as defined by DASI, is documented as being >/= 4 METS.  No other changes were made to his medication regimen.  Plans were to update TTE to assess cardiac function and LVEF.  Cardiology noted that this testing could be performed following upcoming surgery.  Patient to follow-up with outpatient cardiology in 3 months or sooner if needed.  [name] is scheduled for an HOLEP-LASER ENUCLEATION OF THE PROSTATE WITH MORCELLATION; CYSTOSCOPY WITH LITHOLAPAXY on 10/22/2022 with Dr. Legrand Rams, MD.  Given patient's past medical history significant for cardiovascular diagnoses, presurgical cardiac clearance was sought by the PAT team.  Per cardiology, "patient denied any angina or DOE. EKG today showed NSR. Patient is at a low risk for any perioperative cardiac complications for a low to intermediate risk procedure, HoLEP based on his comorbidities (which are none except for paroxysmal A-fib). I am obtaining 2D echocardiogram to update his LVEF, but this can be performed after surgery. I will also start him on systemic anticoagulation after his surgeries".    In review of his medication reconciliation, the patient is not noted to be taking any type of anticoagulation or antiplatelet therapies that would need to be held during his perioperative course.  Patient denies previous perioperative complications with anesthesia in the past. In review of the available records, it is noted that patient underwent a general anesthetic course at [facility] ([facility]) in 07/2022 without documented complications.      10/12/2022   11:01 AM 10/11/2022   10:00 AM 09/28/2022    1:41 PM  Vitals with BMI  Height  6\' 2"  6\' 2"   Weight  224 lbs 14 oz 223 lbs  BMI  28.86 28.62  Systolic 128  124  Diastolic 79  79  Pulse 68  67    Providers/Specialists:   NOTE: Primary physician provider  listed below. Patient may have been seen by APP or partner within same practice.   PROVIDER ROLE / SPECIALTY LAST Lise Auer, MD Urology (Surgeon) 10/12/2022  Aliene Beams, MD Primary Care Provider 09/10/2022  Marjo Bicker, MD  Cardiology 09/22/2022   Allergies:  Patient has no known allergies.  Current Home Medications:   No current facility-administered medications for this encounter.    cephALEXin (KEFLEX) 500 MG capsule   finasteride (PROSCAR) 5 MG tablet   metoprolol tartrate (LOPRESSOR) 25 MG tablet   Multiple Vitamin (MULTIVITAMIN) tablet   ondansetron (ZOFRAN-ODT) 8 MG disintegrating tablet   oxyCODONE (OXY IR/ROXICODONE) 5 MG immediate release tablet   sildenafil (VIAGRA) 100 MG tablet   tamsulosin (FLOMAX) 0.4 MG CAPS capsule   History:   Past Medical History:  Diagnosis Date   A-fib (HCC)    a.) CHA2DS2VASc = 2 (age, vascular disease history);  b.) rate/rhythm maintained on oral metoprolol tartrate; no chronic anticoagulation   Adenomatous polyps    Aortic atherosclerosis (HCC)    Bladder stones    BPH with urinary obstruction    Complication of anesthesia    a.) acute postoperative urinary retention in 07/2022 following foot surgery at [facility] --> required visit to ED and placement of foley catheter x 2 months   Erectile dysfunction    a.) on PDE5i (sildenafil)   Family history of colon cancer    Foot lesion    Hepatic steatosis    History of kidney stones    Liver hemangioma    OSA (obstructive sleep apnea) 10/01/2022  a.) s/p PSG 10/01/2022 --> moderate sleep apnea (AHI 15.7/hr); awaiting CPAP machine   S/P dilatation of esophageal stricture    Syncope and collapse 08/2022   a.) s/p initiation of flomax + finasteride  --> fell and hit head sustaining laceration that ultimately required skin closure with staples   Past Surgical History:  Procedure Laterality Date   BACK SURGERY  2003   lumbar-ruptured disc   COLONOSCOPY  06/23/2011    Procedure: COLONOSCOPY;  Surgeon: Malissa Hippo, MD;  [facility];  Service: Endoscopy;  Laterality: N/A;  3:00   COLONOSCOPY N/A 07/15/2016   Procedure: COLONOSCOPY;  Surgeon: Malissa Hippo, MD;  Location: [facility];  Service: Endoscopy;  Laterality: N/A;  830   COLONOSCOPY WITH PROPOFOL N/A 08/27/2021   Procedure: COLONOSCOPY WITH PROPOFOL;  Surgeon: Malissa Hippo, MD;  Location: [facility];  Service: Endoscopy;  Laterality: N/A;  730   ESOPHAGOGASTRODUODENOSCOPY     left lytic foot lesion, left cuboid Left 07/2022   [facility]   Family History  Problem Relation Age of Onset   Cancer Father    Social History   Tobacco Use   Smoking status: Never    Passive exposure: Never   Smokeless tobacco: Never  Vaping Use   Vaping Use: Never used  Substance Use Topics   Alcohol use: No   Drug use: No    Pertinent Clinical Results:  LABS:   Lab Results  Component Value Date   WBC 7.8 09/09/2022   HGB 14.2 09/09/2022   HCT 42.6 09/09/2022   MCV 89.1 09/09/2022   PLT 258 09/09/2022   Lab Results  Component Value Date   NA 137 09/09/2022   K 3.7 09/09/2022   CO2 23 09/09/2022   GLUCOSE 157 (H) 09/09/2022   BUN 17 09/09/2022   CREATININE 1.19 09/09/2022   CALCIUM 8.5 (L) 09/09/2022   GFRNONAA >60 09/09/2022    ECG: Date: 09/22/2022 Time ECG obtained: 1139 AM Rate: 73 bpm Rhythm: normal sinus Axis (leads I and aVF): Normal Intervals: PR 152 ms. QRS 78 ms. QTc 423 ms. ST segment and T wave changes: No evidence of acute ST segment elevation or depression Comparison: Previous tracing obtained on 09/09/2022 showed atrial fibrillation at a rate of 98 bpm.   IMAGING / PROCEDURES: MR LIVER W WO CONTRAST performed on 09/17/2022 The lesion of concern posteriorly in the right hepatic lobe is typical of a giant hemangioma. There is a smaller hemangioma inferiorly in the right hepatic lobe. No suspicious liver lesions. No acute findings.  No residual  hydronephrosis. Mild hepatic steatosis.  CT RENAL STONE STUDY performed on 09/09/2022 She has mild left hydronephrosis is noted secondary to 5 mm calculus in distal left ureter.  Small nonobstructive right renal calculus. Multiple bladder calculi are noted, the largest measuring 15 mm. Mildly dilated and air-filled small bowel loops are noted in left side of the abdomen concerning for ileus or possibly partial small bowel obstruction. 10 x 7 cm ill-defined slightly complex rounded hypoechoic abnormality is noted in posterior segment of right hepatic lobe which appears to be slightly enlarged compared to prior exam. Given the lack of change, this most likely represents hemangioma or other benign pathology, but neoplasm cannot be excluded. When the patient is clinically stable and able to follow directions and hold their breath (preferably as an outpatient) further evaluation with dedicated abdominal MRI should be considered. Severe prostatic enlargement. Aortic atherosclerosis   CT ANGIO CHEST PE W  AND/OR WO CONTRAST performed on 08/25/2022 No acute findings in the thorax to account for the patient's symptoms. Specifically, no evidence of pulmonary embolism. Large mass-like area in the posterior aspect of the right lobe of the liver, similar to prior examination from 2019. This may simply represent a benign lesion such as a large cavernous hemangioma or focal nodular hyperplasia, however, further characterization with follow-up nonemergent outpatient abdominal MRI with and without IV gadolinium should be considered to provide definitive characterization.  CT HEAD AND CERVICAL SPINE WO CONTRAST  Posterior, superior scalp soft tissue injury without underlying skull fracture. Normal for age non contrast CT appearance of the brain Inflammatory appearing left frontal sinusitis. No acute traumatic injury identified in the cervical spine. Lower cervical spine disc and endplate degeneration with  capacious underlying spinal canal.  TRANSTHORACIC ECHOCARDIOGRAM performed on 05/24/2011 Normal left ventricular systolic function with an EF of 55-60% No regional wall motion abnormalities Normal left ventricular diastolic Doppler parameters Normal right ventricular systolic function Mildly calcified aortic annulus No significant valvular regurgitation Normal transvalvular gradients; no valvular stenosis No pericardial effusion  Impression and Plan:  [name] has been referred for pre-anesthesia review and clearance prior to him undergoing the planned anesthetic and procedural courses. Available labs, pertinent testing, and imaging results were personally reviewed by me in preparation for upcoming operative/procedural course. [name] medical record has been updated following extensive record review and patient interview with PAT staff.   This patient has been appropriately cleared by cardiology with an overall ACCEPTABLE risk of experiencing significant perioperative cardiovascular complications. Based on clinical review performed today (10/14/22), barring any significant acute changes in the patient's overall condition, it is anticipated that he will be able to proceed with the planned surgical intervention. Any acute changes in clinical condition may necessitate his procedure being postponed and/or cancelled. Patient will meet with anesthesia team (MD and/or CRNA) on the day of his procedure for preoperative evaluation/assessment. Questions regarding anesthetic course will be fielded at that time.   Pre-surgical instructions were reviewed with the patient during his PAT appointment, and questions were fielded to satisfaction by PAT clinical staff. He has been instructed on which medications that he will need to hold prior to surgery, as well as the ones that have been deemed safe/appropriate to take on the day of his procedure. As part of the general education provided by PAT, patient  made aware both verbally and in writing, that he would need to abstain from the use of any illegal substances during his perioperative course.  He was advised that failure to follow the provided instructions could necessitate case cancellation or result in serious perioperative complications up to and including death. Patient encouraged to contact PAT and/or his surgeon's office to discuss any questions or concerns that may arise prior to surgery; verbalized understanding.   Quentin Mulling, MSN, APRN, FNP-C, CEN [facility]  Peri-operative Services Nurse Practitioner Phone: [phone] Fax: [phone] 10/14/22 3:52 PM  NOTE: This note has been prepared using Dragon dictation software. Despite my best ability to proofread, there is always the potential that unintentional transcriptional errors may still occur from this process.

## 2022-10-15 ENCOUNTER — Emergency Department (HOSPITAL_COMMUNITY): Payer: Medicare Other

## 2022-10-15 ENCOUNTER — Other Ambulatory Visit: Payer: Self-pay

## 2022-10-15 ENCOUNTER — Observation Stay (HOSPITAL_COMMUNITY)
Admission: EM | Admit: 2022-10-15 | Discharge: 2022-10-16 | Disposition: A | Discharge status: Home / Self Care | Payer: Medicare Other | Attending: Internal Medicine | Admitting: Internal Medicine

## 2022-10-15 ENCOUNTER — Encounter (HOSPITAL_COMMUNITY): Payer: Self-pay | Admitting: Emergency Medicine

## 2022-10-15 DIAGNOSIS — N400 Enlarged prostate without lower urinary tract symptoms: Secondary | ICD-10-CM | POA: Insufficient documentation

## 2022-10-15 DIAGNOSIS — N200 Calculus of kidney: Secondary | ICD-10-CM | POA: Diagnosis not present

## 2022-10-15 DIAGNOSIS — Z79899 Other long term (current) drug therapy: Secondary | ICD-10-CM | POA: Diagnosis not present

## 2022-10-15 DIAGNOSIS — N138 Other obstructive and reflux uropathy: Secondary | ICD-10-CM

## 2022-10-15 DIAGNOSIS — N179 Acute kidney failure, unspecified: Secondary | ICD-10-CM | POA: Insufficient documentation

## 2022-10-15 DIAGNOSIS — K5730 Diverticulosis of large intestine without perforation or abscess without bleeding: Secondary | ICD-10-CM | POA: Diagnosis not present

## 2022-10-15 DIAGNOSIS — I480 Paroxysmal atrial fibrillation: Secondary | ICD-10-CM | POA: Diagnosis not present

## 2022-10-15 DIAGNOSIS — N202 Calculus of kidney with calculus of ureter: Principal | ICD-10-CM | POA: Insufficient documentation

## 2022-10-15 DIAGNOSIS — N10 Acute tubulo-interstitial nephritis: Secondary | ICD-10-CM

## 2022-10-15 DIAGNOSIS — N12 Tubulo-interstitial nephritis, not specified as acute or chronic: Secondary | ICD-10-CM

## 2022-10-15 DIAGNOSIS — R339 Retention of urine, unspecified: Secondary | ICD-10-CM | POA: Diagnosis not present

## 2022-10-15 DIAGNOSIS — R109 Unspecified abdominal pain: Secondary | ICD-10-CM | POA: Diagnosis not present

## 2022-10-15 DIAGNOSIS — N139 Obstructive and reflux uropathy, unspecified: Secondary | ICD-10-CM | POA: Insufficient documentation

## 2022-10-15 DIAGNOSIS — N401 Enlarged prostate with lower urinary tract symptoms: Secondary | ICD-10-CM

## 2022-10-15 DIAGNOSIS — N210 Calculus in bladder: Secondary | ICD-10-CM

## 2022-10-15 LAB — CBC
HCT: 45.4 % (ref 39.0–52.0)
Hemoglobin: 15.3 g/dL (ref 13.0–17.0)
MCH: 29.5 pg (ref 26.0–34.0)
MCHC: 33.7 g/dL (ref 30.0–36.0)
MCV: 87.5 fL (ref 80.0–100.0)
Platelets: 239 10*3/uL (ref 150–400)
RBC: 5.19 MIL/uL (ref 4.22–5.81)
RDW: 12.8 % (ref 11.5–15.5)
WBC: 11.3 10*3/uL — ABNORMAL HIGH (ref 4.0–10.5)
nRBC: 0 % (ref 0.0–0.2)

## 2022-10-15 LAB — BASIC METABOLIC PANEL
Anion gap: 15 (ref 5–15)
BUN: 19 mg/dL (ref 8–23)
CO2: 21 mmol/L — ABNORMAL LOW (ref 22–32)
Calcium: 9.3 mg/dL (ref 8.9–10.3)
Chloride: 102 mmol/L (ref 98–111)
Creatinine, Ser: 1.62 mg/dL — ABNORMAL HIGH (ref 0.61–1.24)
GFR, Estimated: 47 mL/min — ABNORMAL LOW (ref >60)
Glucose, Bld: 183 mg/dL — ABNORMAL HIGH (ref 70–99)
Potassium: 4.4 mmol/L (ref 3.5–5.1)
Sodium: 138 mmol/L (ref 135–145)

## 2022-10-15 LAB — URINALYSIS, ROUTINE W REFLEX MICROSCOPIC
Bilirubin Urine: NEGATIVE
Glucose, UA: NEGATIVE mg/dL
Ketones, ur: NEGATIVE mg/dL
Nitrite: POSITIVE — AB
Protein, ur: 100 mg/dL — AB
RBC / HPF: >50 RBC/hpf (ref 0–5)
Specific Gravity, Urine: 1.021 (ref 1.005–1.030)
WBC, UA: >50 WBC/hpf (ref 0–5)
pH: 6 (ref 5.0–8.0)

## 2022-10-15 MED ORDER — TAMSULOSIN HCL 0.4 MG PO CAPS
0.4000 mg | ORAL_CAPSULE | ORAL | Status: AC
  Administered 2022-10-15: 0.4 mg via ORAL
  Filled 2022-10-15: qty 1

## 2022-10-15 MED ORDER — MORPHINE SULFATE (PF) 4 MG/ML IV SOLN: 4.0000 mg | Freq: Once | INTRAVENOUS | Status: DC

## 2022-10-15 MED ORDER — SODIUM CHLORIDE 0.9 % IV SOLN
1.0000 g | Freq: Once | INTRAVENOUS | Status: AC
  Administered 2022-10-15: 1 g via INTRAVENOUS
  Filled 2022-10-15: qty 10

## 2022-10-15 MED ORDER — SODIUM CHLORIDE 0.9 % IV SOLN: 1.0000 g | INTRAVENOUS | Status: DC

## 2022-10-15 MED ORDER — HYDROMORPHONE HCL 1 MG/ML IJ SOLN
1.0000 mg | Freq: Once | INTRAMUSCULAR | Status: AC
  Administered 2022-10-15: 1 mg via INTRAVENOUS
  Filled 2022-10-15: qty 1

## 2022-10-15 MED ORDER — MORPHINE SULFATE (PF) 4 MG/ML IV SOLN
6.0000 mg | INTRAVENOUS | Status: DC | PRN
  Administered 2022-10-15 – 2022-10-16 (×2): 6 mg via INTRAVENOUS
  Filled 2022-10-15 (×2): qty 2

## 2022-10-15 MED ORDER — OXYCODONE-ACETAMINOPHEN 5-325 MG PO TABS: 1.0000 | ORAL_TABLET | ORAL | Status: DC | PRN

## 2022-10-15 MED ORDER — ONDANSETRON HCL 4 MG/2ML IJ SOLN
4.0000 mg | Freq: Once | INTRAMUSCULAR | Status: AC
  Administered 2022-10-15: 4 mg via INTRAVENOUS
  Filled 2022-10-15: qty 2

## 2022-10-15 MED ORDER — IOHEXOL 300 MG/ML  SOLN
100.0000 mL | Freq: Once | INTRAMUSCULAR | Status: AC | PRN
  Administered 2022-10-15: 100 mL via INTRAVENOUS

## 2022-10-15 MED ORDER — SODIUM CHLORIDE 0.9 % IV SOLN: INTRAVENOUS | Status: DC

## 2022-10-15 MED ORDER — MORPHINE SULFATE (PF) 4 MG/ML IV SOLN
6.0000 mg | Freq: Once | INTRAVENOUS | Status: AC
  Administered 2022-10-15: 6 mg via INTRAVENOUS
  Filled 2022-10-15: qty 2

## 2022-10-15 MED ORDER — LACTATED RINGERS IV BOLUS
1000.0000 mL | Freq: Once | INTRAVENOUS | Status: AC
  Administered 2022-10-15: 1000 mL via INTRAVENOUS

## 2022-10-15 NOTE — ED Notes (Signed)
Report called to Olivia, RN 

## 2022-10-15 NOTE — Assessment & Plan Note (Addendum)
-   Creatinine increased from 1.19>> 1.62 - Secondary to obstructive uropathy -Underwent fluid resuscitation as well - Creatinine showed some improvement, 1.53 and should continue to improve after stent placement - Repeat BMP at follow-up

## 2022-10-15 NOTE — Assessment & Plan Note (Signed)
-   per CT: "Moderate obstructive uropathy on the left with a 5 mm calculus in the bladder on the left in the region of the ureterovesicular junction suggesting recently passed stone. There is a delayed nephrogram on the left with perinephric and periureteral fat stranding, which may be infectious or inflammatory. Diffuse bladder wall thickening, suggestive of infectious or inflammatory cystitis. Multiple bladder calculi are noted. Nonobstructive right renal calculus." -Evaluated by urology and patient underwent cystoscopy with left ureteral stent placement on 10/16/2022 -UA noted with large LE, positive nitrite, many bacteria, greater than 50 WBC - Rocephin started on admission.  Patient discharged on 7-day course of Omnicef -Urine culture in progress at time of discharge

## 2022-10-15 NOTE — Assessment & Plan Note (Signed)
-   Left perinephric stranding noted on CT - UA consistent with infection - Urine culture pending at discharge - Continued on Omnicef course at discharge

## 2022-10-15 NOTE — ED Provider Notes (Signed)
[place] [facility] DEPARTMENT AT [facility] Provider Note   CSN: [id number] Arrival date & time: 10/15/22  1906     History {Add pertinent medical, surgical, social history, OB history to HPI:1} Chief Complaint  Patient presents with   Flank Pain    [name] is a 67 y.o. male.  Pain today  L flank sharp feverish n/v No gi sxs Indwelling foley bph Hx of stones       Home Medications Prior to Admission medications   Medication Sig Start Date End Date Taking? Authorizing Provider  cephALEXin (KEFLEX) 500 MG capsule Take 1 capsule (500 mg total) by mouth 2 (two) times daily for 6 doses. Start on 5/28 10/12/22 10/15/22  Marcine Matar, MD  finasteride (PROSCAR) 5 MG tablet Take 1 tablet (5 mg total) by mouth daily. Patient taking differently: Take 5 mg by mouth every morning. 08/31/22   Marcine Matar, MD  metoprolol tartrate (LOPRESSOR) 25 MG tablet Take 0.5 tablets (12.5 mg total) by mouth 2 (two) times daily. 09/22/22 12/21/22  Mallipeddi, Vishnu P, MD  Multiple Vitamin (MULTIVITAMIN) tablet Take 1 tablet by mouth daily.     [provider]  ondansetron (ZOFRAN-ODT) 8 MG disintegrating tablet Take 1 tablet (8 mg total) by mouth every 8 (eight) hours as needed for nausea. 09/09/22   Derwood Kaplan, MD  oxyCODONE (OXY IR/ROXICODONE) 5 MG immediate release tablet Take 5 mg by mouth every 4 (four) hours as needed. 08/20/22   [provider]  sildenafil (VIAGRA) 100 MG tablet 1/2 to 1 tab po prn Patient taking differently: Take 50-100 mg by mouth as needed for erectile dysfunction. 1/2 to 1 tab po prn 01/26/22   Marcine Matar, MD  tamsulosin (FLOMAX) 0.4 MG CAPS capsule Take 1 capsule (0.4 mg total) by mouth daily after supper. Patient taking differently: Take 0.4 mg by mouth daily after breakfast. 09/20/22   Marcine Matar, MD      Allergies    Patient has no known allergies.    Review of Systems   Review of Systems  Physical  Exam Updated Vital Signs BP (!) 162/96   Pulse 87   Temp 98.1 F (36.7 C) (Oral)   Resp 18   Ht 6\' 2"  (1.88 m)   Wt 97.5 kg   SpO2 99%   BMI 27.60 kg/m  Physical Exam  ED Results / Procedures / Treatments   Labs (all labs ordered are listed, but only abnormal results are displayed) Labs Reviewed  URINALYSIS, ROUTINE W REFLEX MICROSCOPIC - Abnormal; Notable for the following components:      Result Value   APPearance CLOUDY (*)    Hgb urine dipstick LARGE (*)    Protein, ur 100 (*)    Nitrite POSITIVE (*)    Leukocytes,Ua LARGE (*)    Bacteria, UA MANY (*)    All other components within normal limits  BASIC METABOLIC PANEL - Abnormal; Notable for the following components:   CO2 21 (*)    Glucose, Bld 183 (*)    Creatinine, Ser 1.62 (*)    GFR, Estimated 47 (*)    All other components within normal limits  CBC - Abnormal; Notable for the following components:   WBC 11.3 (*)    All other components within normal limits  URINE CULTURE    EKG None  Radiology No results found.  Procedures Procedures  {Document cardiac monitor, telemetry assessment procedure when appropriate:1}  Medications Ordered in ED Medications  lactated ringers bolus  1,000 mL (has no administration in time range)  cefTRIAXone (ROCEPHIN) 1 g in sodium chloride 0.9 % 100 mL IVPB (has no administration in time range)  ondansetron (ZOFRAN) injection 4 mg (has no administration in time range)  morphine (PF) 4 MG/ML injection 6 mg (has no administration in time range)    ED Course/ Medical Decision Making/ A&P   {   Click here for ABCD2, HEART and other calculatorsREFRESH Note before signing :1}                          Medical Decision Making Amount and/or Complexity of Data Reviewed Labs: ordered. Radiology: ordered.  Risk Prescription drug management.   ***  {Document critical care time when appropriate:1} {Document review of labs and clinical decision tools ie heart score,  Chads2Vasc2 etc:1}  {Document your independent review of radiology images, and any outside records:1} {Document your discussion with family members, caretakers, and with consultants:1} {Document social determinants of health affecting pt's care:1} {Document your decision making why or why not admission, treatments were needed:1} Final Clinical Impression(s) / ED Diagnoses Final diagnoses:  None    Rx / DC Orders ED Discharge Orders     None

## 2022-10-15 NOTE — Assessment & Plan Note (Addendum)
-   Continue metoprolol -No anticoagulation noted

## 2022-10-15 NOTE — ED Triage Notes (Addendum)
Pt via POV w/wife reporting left flank pain, known dx renal stones. Pt has an indwelling catheter related to renal stones which has been in place for about a month, replaced 3 days ago PTA. Pt is scheduled for a prostate procedure and is expected to keep indwelling catheter in place until the surgery takes place. Pain 8/10 and pt is nauseated and clearly uncomfortable. States he took hydrocodone around noon and oxycodone at about 6pm PTA.

## 2022-10-15 NOTE — Assessment & Plan Note (Signed)
-   Continue Proscar and tamsulosin - Indwelling Foley swapped out - Continue to monitor

## 2022-10-15 NOTE — H&P (Signed)
History and Physical    Patient: [name] WUJ:[id number] DOB: [birth year] DOA: 10/15/2022 DOS: the patient was seen and examined on 10/15/2022 PCP: Aliene Beams, MD  Patient coming from: Home  Chief Complaint:  Chief Complaint  Patient presents with   Flank Pain   HPI: [name] is a 67 y.o. male with medical history significant of liver hemangioma, paroxysmal atrial fibrillation, BPH, and more presents the ED with a chief complaint of pain.  Patient reports that he had pain that felt like a kidney stone.  He first felt this pain 5 weeks ago and was seen for it at that time.  He reports that the pain has not really been bothering him since then, but at lunchtime today he had a sudden onset of left flank pain.  He describes it as severe.  He cannot characterize the pain other than to say that it "really hurts."  He had a prescription for hydrocodone and oxycodone.  He tried each and they did not work for his pain.  Patient reports that he always has blood in his urine but he has not had any more than normal today.  He did recently have his Foley changed 3 days ago and had some bleeding at that time that was worse than his normal.  He reports he always has bladder spasms and those have continued today as well.  He has felt feverish today.  He has had nausea with a lot of dry heaving but no actual vomiting.  Patient reports he has had a reduced appetite and his last meal was breakfast today.  He had some generalized weakness due to not eating per his report.  He is also had decreased p.o. fluid intake because he is worried he will throw it up.  Patient reports he has had some palpitations but that is normal with his paroxysmal atrial fibrillation.  He has not had any other complaints at this time.  Patient does not smoke, does not drink.  He is vaccinated for COVID.  Patient is full code, but reports that he would not want to be left on life support long-term. Review of Systems: As mentioned  in the history of present illness. All other systems reviewed and are negative. Past Medical History:  Diagnosis Date   A-fib [facility])    a.) CHA2DS2VASc = 2 (age, vascular disease history);  b.) rate/rhythm maintained on oral metoprolol tartrate; no chronic anticoagulation   Adenomatous polyps    Aortic atherosclerosis (HCC)    Bladder stones    BPH with urinary obstruction    Complication of anesthesia    a.) acute postoperative urinary retention in 07/2022 following foot surgery at [facility] --> required visit to ED and placement of foley catheter x 2 months   Erectile dysfunction    a.) on PDE5i (sildenafil)   Family history of colon cancer    Foot lesion    Hepatic steatosis    History of kidney stones    Liver hemangioma    OSA (obstructive sleep apnea) 10/01/2022   a.) s/p PSG 10/01/2022 --> moderate sleep apnea (AHI 15.7/hr); awaiting CPAP machine   S/P dilatation of esophageal stricture    Syncope and collapse 08/2022   a.) s/p initiation of flomax + finasteride  --> fell and hit head sustaining laceration that ultimately required skin closure with staples   Past Surgical History:  Procedure Laterality Date   BACK SURGERY  2003   lumbar-ruptured disc   COLONOSCOPY  06/23/2011  Procedure: COLONOSCOPY;  Surgeon: Malissa Hippo, MD;  [facility];  Service: Endoscopy;  Laterality: N/A;  3:00   COLONOSCOPY N/A 07/15/2016   Procedure: COLONOSCOPY;  Surgeon: Malissa Hippo, MD;  [facility];  Service: Endoscopy;  Laterality: N/A;  830   COLONOSCOPY WITH PROPOFOL N/A 08/27/2021   Procedure: COLONOSCOPY WITH PROPOFOL;  Surgeon: Malissa Hippo, MD;  [facility];  Service: Endoscopy;  Laterality: N/A;  730   ESOPHAGOGASTRODUODENOSCOPY     left lytic foot lesion, left cuboid Left 07/2022   [facility]   Social History:  reports that he has never smoked. He has never been exposed to tobacco smoke. He has never used smokeless tobacco. He reports that he does  not drink alcohol and does not use drugs.  No Known Allergies  Family History  Problem Relation Age of Onset   Cancer Father     Prior to Admission medications   Medication Sig Start Date End Date Taking? Authorizing Provider  cephALEXin (KEFLEX) 500 MG capsule Take 1 capsule (500 mg total) by mouth 2 (two) times daily for 6 doses. Start on 5/28 10/12/22 10/15/22  Marcine Matar, MD  finasteride (PROSCAR) 5 MG tablet Take 1 tablet (5 mg total) by mouth daily. Patient taking differently: Take 5 mg by mouth every morning. 08/31/22   Marcine Matar, MD  metoprolol tartrate (LOPRESSOR) 25 MG tablet Take 0.5 tablets (12.5 mg total) by mouth 2 (two) times daily. 09/22/22 12/21/22  Mallipeddi, Vishnu P, MD  Multiple Vitamin (MULTIVITAMIN) tablet Take 1 tablet by mouth daily.     [provider]  ondansetron (ZOFRAN-ODT) 8 MG disintegrating tablet Take 1 tablet (8 mg total) by mouth every 8 (eight) hours as needed for nausea. 09/09/22   Derwood Kaplan, MD  oxyCODONE (OXY IR/ROXICODONE) 5 MG immediate release tablet Take 5 mg by mouth every 4 (four) hours as needed. 08/20/22   [provider]  sildenafil (VIAGRA) 100 MG tablet 1/2 to 1 tab po prn Patient taking differently: Take 50-100 mg by mouth as needed for erectile dysfunction. 1/2 to 1 tab po prn 01/26/22   Marcine Matar, MD  tamsulosin (FLOMAX) 0.4 MG CAPS capsule Take 1 capsule (0.4 mg total) by mouth daily after supper. Patient taking differently: Take 0.4 mg by mouth daily after breakfast. 09/20/22   Marcine Matar, MD    Physical Exam: Vitals:   10/15/22 1941 10/15/22 1942 10/15/22 2329  BP: (!) 162/96  (!) 174/85  Pulse: 87  92  Resp: 18  20  Temp: 98.1 F (36.7 C)    TempSrc: Oral    SpO2: 99%  99%  Weight:  97.5 kg   Height:  6\' 2"  (1.88 m)    1.  General: Patient lying supine in bed,  no acute distress   2. Psychiatric: Alert and oriented x 3, mood and behavior normal for situation, pleasant and  cooperative with exam   3. Neurologic: Speech and language are normal, face is symmetric, moves all 4 extremities voluntarily, at baseline without acute deficits on limited exam   4. HEENMT:  Head is atraumatic, normocephalic, pupils reactive to light, neck is supple, trachea is midline, mucous membranes are moist   5. Respiratory : Lungs are clear to auscultation bilaterally without wheezing, rhonchi, rales, no cyanosis, no increase in work of breathing or accessory muscle use   6. Cardiovascular : S 1 S2 auscultated, normal rate, no murmurs, rubs or gallops, no peripheral edema, peripheral pulses  palpated   7. Gastrointestinal:  Abdomen is soft, nondistended, tenderness to palpation at left flank and in suprapubic region, bowel sounds active, no masses or organomegaly palpated   8. Skin:  Skin is warm, dry and intact without rashes, acute lesions, or ulcers on limited exam   9.Musculoskeletal:  No acute deformities or trauma, no asymmetry in tone, no peripheral edema, peripheral pulses palpated, no tenderness to palpation in the extremities  Data Reviewed: In the ED Temp 98.1, heart rate 87, respiratory 18, blood pressure 162/96, satting at 99% Mild leukocytosis at 11.3, hemoglobin 15.3, platelets 239 Chemistry is unremarkable aside from an AKI with a creatinine of 1.62 Patient was started on Rocephin given 1 L of fluid, given morphine, multiple doses of Zofran, told him to loosen Urology was consulted and recommended admission to [facility] for possible stenting tomorrow Admission requested for infected calculus and obstructive uropathy  Assessment and Plan: * Obstructive uropathy - CT scan shows 5 mm calculus at the UV J with moderate obstructive uropathy and signs for pyelonephritis - Urology consulted and recommends admission to [facility] with a plan to stent - Continue pain control with pain scale - Continue Foley - Continue to monitor  Acute pyelonephritis -  Infected stone with signs of pyelonephritis - UA indicated UTI - Foley swapped out - Rocephin started in the ED - Continue Rocephin - Urology consulted for management of stone  BPH (benign prostatic hyperplasia) - Continue Proscar and tamsulosin - Indwelling Foley swapped out - Continue to monitor  AKI (acute kidney injury) (HCC) - Creatinine increased from 1.19>> 1.62 - Secondary to obstructive uropathy - Urology consulted and plans to stent for obstructing calculus - Continue IV fluids - Avoid nephrotoxic agents when possible - Trend in a.m.   Paroxysmal A-fib (HCC) - Continue metoprolol - Patient is not on blood thinners because of planned procedures      Advance Care Planning:   Code Status: Full Code  Consults: Urology  Family Communication: Wife at bedside  Severity of Illness: The appropriate patient status for this patient is OBSERVATION. Observation status is judged to be reasonable and necessary in order to provide the required intensity of service to ensure the patient's safety. The patient's presenting symptoms, physical exam findings, and initial radiographic and laboratory data in the context of their medical condition is felt to place them at decreased risk for further clinical deterioration. Furthermore, it is anticipated that the patient will be medically stable for discharge from the hospital within 2 midnights of admission.   Author: Lilyan Gilford, DO 10/15/2022 11:30 PM  For on call review [email or web address].

## 2022-10-16 ENCOUNTER — Encounter (HOSPITAL_COMMUNITY): Payer: Self-pay | Admitting: Family Medicine

## 2022-10-16 ENCOUNTER — Encounter (HOSPITAL_COMMUNITY)
Admission: EM | Disposition: A | Discharge status: Home / Self Care | Payer: Self-pay | Source: Home / Self Care | Attending: Emergency Medicine

## 2022-10-16 ENCOUNTER — Observation Stay (HOSPITAL_COMMUNITY): Payer: Medicare Other

## 2022-10-16 ENCOUNTER — Observation Stay (HOSPITAL_BASED_OUTPATIENT_CLINIC_OR_DEPARTMENT_OTHER): Payer: Medicare Other | Admitting: Anesthesiology

## 2022-10-16 ENCOUNTER — Observation Stay (HOSPITAL_COMMUNITY): Payer: Medicare Other | Admitting: Anesthesiology

## 2022-10-16 DIAGNOSIS — G4730 Sleep apnea, unspecified: Secondary | ICD-10-CM | POA: Diagnosis not present

## 2022-10-16 DIAGNOSIS — N132 Hydronephrosis with renal and ureteral calculous obstruction: Secondary | ICD-10-CM | POA: Diagnosis not present

## 2022-10-16 DIAGNOSIS — N201 Calculus of ureter: Secondary | ICD-10-CM | POA: Diagnosis not present

## 2022-10-16 DIAGNOSIS — N179 Acute kidney failure, unspecified: Secondary | ICD-10-CM | POA: Diagnosis not present

## 2022-10-16 DIAGNOSIS — N10 Acute tubulo-interstitial nephritis: Secondary | ICD-10-CM | POA: Diagnosis not present

## 2022-10-16 DIAGNOSIS — I4891 Unspecified atrial fibrillation: Secondary | ICD-10-CM

## 2022-10-16 DIAGNOSIS — R338 Other retention of urine: Secondary | ICD-10-CM

## 2022-10-16 DIAGNOSIS — N401 Enlarged prostate with lower urinary tract symptoms: Secondary | ICD-10-CM

## 2022-10-16 DIAGNOSIS — N390 Urinary tract infection, site not specified: Secondary | ICD-10-CM | POA: Diagnosis not present

## 2022-10-16 DIAGNOSIS — N139 Obstructive and reflux uropathy, unspecified: Secondary | ICD-10-CM | POA: Diagnosis not present

## 2022-10-16 HISTORY — PX: CYSTOSCOPY W/ URETERAL STENT PLACEMENT: SHX1429

## 2022-10-16 LAB — CBC WITH DIFFERENTIAL/PLATELET
Abs Immature Granulocytes: 0.06 10*3/uL (ref 0.00–0.07)
Basophils Absolute: 0 10*3/uL (ref 0.0–0.1)
Basophils Relative: 0 %
Eosinophils Absolute: 0 10*3/uL (ref 0.0–0.5)
Eosinophils Relative: 0 %
HCT: 43.4 % (ref 39.0–52.0)
Hemoglobin: 14.5 g/dL (ref 13.0–17.0)
Immature Granulocytes: 1 %
Lymphocytes Relative: 9 %
Lymphs Abs: 1.1 10*3/uL (ref 0.7–4.0)
MCH: 29.4 pg (ref 26.0–34.0)
MCHC: 33.4 g/dL (ref 30.0–36.0)
MCV: 87.9 fL (ref 80.0–100.0)
Monocytes Absolute: 0.8 10*3/uL (ref 0.1–1.0)
Monocytes Relative: 6 %
Neutro Abs: 10.3 10*3/uL — ABNORMAL HIGH (ref 1.7–7.7)
Neutrophils Relative %: 84 %
Platelets: 228 10*3/uL (ref 150–400)
RBC: 4.94 MIL/uL (ref 4.22–5.81)
RDW: 12.8 % (ref 11.5–15.5)
WBC: 12.3 10*3/uL — ABNORMAL HIGH (ref 4.0–10.5)
nRBC: 0 % (ref 0.0–0.2)

## 2022-10-16 LAB — COMPREHENSIVE METABOLIC PANEL
ALT: 24 U/L (ref 0–44)
AST: 23 U/L (ref 15–41)
Albumin: 3.9 g/dL (ref 3.5–5.0)
Alkaline Phosphatase: 63 U/L (ref 38–126)
Anion gap: 7 (ref 5–15)
BUN: 19 mg/dL (ref 8–23)
CO2: 26 mmol/L (ref 22–32)
Calcium: 8.8 mg/dL — ABNORMAL LOW (ref 8.9–10.3)
Chloride: 105 mmol/L (ref 98–111)
Creatinine, Ser: 1.53 mg/dL — ABNORMAL HIGH (ref 0.61–1.24)
GFR, Estimated: 50 mL/min — ABNORMAL LOW (ref >60)
Glucose, Bld: 134 mg/dL — ABNORMAL HIGH (ref 70–99)
Potassium: 4.5 mmol/L (ref 3.5–5.1)
Sodium: 138 mmol/L (ref 135–145)
Total Bilirubin: 0.7 mg/dL (ref 0.3–1.2)
Total Protein: 7.4 g/dL (ref 6.5–8.1)

## 2022-10-16 LAB — URINE CULTURE

## 2022-10-16 LAB — MAGNESIUM: Magnesium: 2 mg/dL (ref 1.7–2.4)

## 2022-10-16 LAB — HIV ANTIBODY (ROUTINE TESTING W REFLEX): HIV Screen 4th Generation wRfx: NONREACTIVE

## 2022-10-16 SURGERY — CYSTOSCOPY, FLEXIBLE, WITH STENT REPLACEMENT
Anesthesia: General | Site: Ureter | Laterality: Left

## 2022-10-16 MED ORDER — FINASTERIDE 5 MG PO TABS
5.0000 mg | ORAL_TABLET | Freq: Every day | ORAL | Status: DC
  Administered 2022-10-16: 5 mg via ORAL
  Filled 2022-10-16: qty 1

## 2022-10-16 MED ORDER — DEXAMETHASONE SODIUM PHOSPHATE 10 MG/ML IJ SOLN
INTRAMUSCULAR | Status: DC | PRN
  Administered 2022-10-16: 8 mg via INTRAVENOUS

## 2022-10-16 MED ORDER — METOPROLOL TARTRATE 25 MG PO TABS
12.5000 mg | ORAL_TABLET | Freq: Two times a day (BID) | ORAL | Status: DC
  Administered 2022-10-16 (×2): 12.5 mg via ORAL
  Filled 2022-10-16 (×2): qty 1

## 2022-10-16 MED ORDER — TRAMADOL HCL 50 MG PO TABS: 50.0000 mg | ORAL_TABLET | Freq: Four times a day (QID) | ORAL | Status: DC | PRN

## 2022-10-16 MED ORDER — ONDANSETRON HCL 4 MG/2ML IJ SOLN
INTRAMUSCULAR | Status: DC | PRN
  Administered 2022-10-16: 4 mg via INTRAVENOUS

## 2022-10-16 MED ORDER — ONDANSETRON HCL 4 MG/2ML IJ SOLN
INTRAMUSCULAR | Status: AC
  Filled 2022-10-16: qty 2

## 2022-10-16 MED ORDER — LIDOCAINE 2% (20 MG/ML) 5 ML SYRINGE
INTRAMUSCULAR | Status: DC | PRN
  Administered 2022-10-16: 100 mg via INTRAVENOUS

## 2022-10-16 MED ORDER — ONDANSETRON HCL 4 MG/2ML IJ SOLN: 4.0000 mg | Freq: Four times a day (QID) | INTRAMUSCULAR | Status: DC | PRN

## 2022-10-16 MED ORDER — OXYCODONE HCL 5 MG PO TABS: 5.0000 mg | ORAL_TABLET | ORAL | Status: DC | PRN

## 2022-10-16 MED ORDER — PROPOFOL 10 MG/ML IV BOLUS
INTRAVENOUS | Status: AC
  Filled 2022-10-16: qty 20

## 2022-10-16 MED ORDER — ONDANSETRON HCL 4 MG PO TABS: 4.0000 mg | ORAL_TABLET | Freq: Four times a day (QID) | ORAL | Status: DC | PRN

## 2022-10-16 MED ORDER — MIDAZOLAM HCL 2 MG/2ML IJ SOLN
INTRAMUSCULAR | Status: AC
  Filled 2022-10-16: qty 2

## 2022-10-16 MED ORDER — ACETAMINOPHEN 650 MG RE SUPP: 650.0000 mg | Freq: Four times a day (QID) | RECTAL | Status: DC | PRN

## 2022-10-16 MED ORDER — CHLORHEXIDINE GLUCONATE 0.12 % MT SOLN: 15.0000 mL | Freq: Once | OROMUCOSAL | Status: DC

## 2022-10-16 MED ORDER — TRANEXAMIC ACID 650 MG PO TABS
1300.0000 mg | ORAL_TABLET | Freq: Once | ORAL | Status: AC
  Administered 2022-10-16: 1300 mg via ORAL
  Filled 2022-10-16: qty 2

## 2022-10-16 MED ORDER — EPHEDRINE SULFATE-NACL 50-0.9 MG/10ML-% IV SOSY
PREFILLED_SYRINGE | INTRAVENOUS | Status: DC | PRN
  Administered 2022-10-16 (×2): 5 mg via INTRAVENOUS

## 2022-10-16 MED ORDER — DEXAMETHASONE SODIUM PHOSPHATE 10 MG/ML IJ SOLN
INTRAMUSCULAR | Status: AC
  Filled 2022-10-16: qty 1

## 2022-10-16 MED ORDER — SCOPOLAMINE 1 MG/3DAYS TD PT72
MEDICATED_PATCH | TRANSDERMAL | Status: AC
  Filled 2022-10-16: qty 1

## 2022-10-16 MED ORDER — MIDAZOLAM HCL 5 MG/5ML IJ SOLN
INTRAMUSCULAR | Status: DC | PRN
  Administered 2022-10-16: 2 mg via INTRAVENOUS

## 2022-10-16 MED ORDER — FENTANYL CITRATE (PF) 100 MCG/2ML IJ SOLN
INTRAMUSCULAR | Status: AC
  Filled 2022-10-16: qty 2

## 2022-10-16 MED ORDER — CEFDINIR 300 MG PO CAPS: 300.0000 mg | ORAL_CAPSULE | Freq: Two times a day (BID) | ORAL | 0 refills | Status: AC

## 2022-10-16 MED ORDER — TAMSULOSIN HCL 0.4 MG PO CAPS
0.4000 mg | ORAL_CAPSULE | Freq: Every day | ORAL | Status: DC
  Administered 2022-10-16: 0.4 mg via ORAL
  Filled 2022-10-16: qty 1

## 2022-10-16 MED ORDER — CEFDINIR 300 MG PO CAPS
300.0000 mg | ORAL_CAPSULE | Freq: Two times a day (BID) | ORAL | Status: DC
  Administered 2022-10-16: 300 mg via ORAL
  Filled 2022-10-16: qty 1

## 2022-10-16 MED ORDER — IOHEXOL 300 MG/ML  SOLN
INTRAMUSCULAR | Status: DC | PRN
  Administered 2022-10-16: 7 mL

## 2022-10-16 MED ORDER — ACETAMINOPHEN 10 MG/ML IV SOLN
INTRAVENOUS | Status: DC | PRN
  Administered 2022-10-16: 1000 mg via INTRAVENOUS

## 2022-10-16 MED ORDER — FENTANYL CITRATE (PF) 100 MCG/2ML IJ SOLN
INTRAMUSCULAR | Status: DC | PRN
  Administered 2022-10-16: 100 ug via INTRAVENOUS

## 2022-10-16 MED ORDER — LIDOCAINE HCL (PF) 2 % IJ SOLN
INTRAMUSCULAR | Status: AC
  Filled 2022-10-16: qty 5

## 2022-10-16 MED ORDER — TRAMADOL HCL 50 MG PO TABS: 50.0000 mg | ORAL_TABLET | Freq: Four times a day (QID) | ORAL | 0 refills | Status: DC | PRN

## 2022-10-16 MED ORDER — HEPARIN SODIUM (PORCINE) 5000 UNIT/ML IJ SOLN: 5000.0000 [IU] | Freq: Three times a day (TID) | INTRAMUSCULAR | Status: DC

## 2022-10-16 MED ORDER — AMISULPRIDE (ANTIEMETIC) 5 MG/2ML IV SOLN: 10.0000 mg | Freq: Once | INTRAVENOUS | Status: DC | PRN

## 2022-10-16 MED ORDER — PROMETHAZINE HCL 25 MG/ML IJ SOLN: 6.2500 mg | INTRAMUSCULAR | Status: DC | PRN

## 2022-10-16 MED ORDER — CEFAZOLIN SODIUM-DEXTROSE 2-4 GM/100ML-% IV SOLN
INTRAVENOUS | Status: AC
  Filled 2022-10-16: qty 100

## 2022-10-16 MED ORDER — ACETAMINOPHEN 325 MG PO TABS: 650.0000 mg | ORAL_TABLET | Freq: Four times a day (QID) | ORAL | Status: DC | PRN

## 2022-10-16 MED ORDER — CEFAZOLIN SODIUM-DEXTROSE 2-4 GM/100ML-% IV SOLN: 2.0000 g | INTRAVENOUS | Status: DC

## 2022-10-16 MED ORDER — ORAL CARE MOUTH RINSE: 15.0000 mL | Freq: Once | OROMUCOSAL | Status: DC

## 2022-10-16 MED ORDER — EPHEDRINE 5 MG/ML INJ
INTRAVENOUS | Status: AC
  Filled 2022-10-16: qty 5

## 2022-10-16 MED ORDER — DIPHENHYDRAMINE HCL 50 MG/ML IJ SOLN
25.0000 mg | Freq: Once | INTRAMUSCULAR | Status: DC
  Filled 2022-10-16: qty 1

## 2022-10-16 MED ORDER — PROPOFOL 10 MG/ML IV BOLUS
INTRAVENOUS | Status: DC | PRN
  Administered 2022-10-16: 150 mg via INTRAVENOUS

## 2022-10-16 MED ORDER — SODIUM CHLORIDE 0.9 % IR SOLN
Status: DC | PRN
  Administered 2022-10-16: 3000 mL

## 2022-10-16 MED ORDER — LACTATED RINGERS IV SOLN: INTRAVENOUS | Status: DC

## 2022-10-16 MED ORDER — ACETAMINOPHEN 10 MG/ML IV SOLN
INTRAVENOUS | Status: AC
  Filled 2022-10-16: qty 100

## 2022-10-16 MED ORDER — FENTANYL CITRATE PF 50 MCG/ML IJ SOSY: 25.0000 ug | PREFILLED_SYRINGE | INTRAMUSCULAR | Status: DC | PRN

## 2022-10-16 SURGICAL SUPPLY — 13 items
BAG URO CATCHER STRL LF (MISCELLANEOUS) ×1 IMPLANT
CATH TIEMANN FOLEY 18FR 5CC (CATHETERS) IMPLANT
CATH URETL OPEN 5X70 (CATHETERS) ×1 IMPLANT
CLOTH BEACON ORANGE TIMEOUT ST (SAFETY) ×1 IMPLANT
GLOVE BIO SURGEON STRL SZ7 (GLOVE) ×1 IMPLANT
GOWN STRL REUS W/ TWL XL LVL3 (GOWN DISPOSABLE) ×1 IMPLANT
GOWN STRL REUS W/TWL XL LVL3 (GOWN DISPOSABLE) ×1
GUIDEWIRE ZIPWRE .038 STRAIGHT (WIRE) ×1 IMPLANT
KIT TURNOVER KIT A (KITS) IMPLANT
MANIFOLD NEPTUNE II (INSTRUMENTS) ×1 IMPLANT
PACK CYSTO (CUSTOM PROCEDURE TRAY) ×1 IMPLANT
STENT URET 6FRX28 CONTOUR (STENTS) IMPLANT
TUBING CONNECTING 10 (TUBING) ×1 IMPLANT

## 2022-10-16 NOTE — Consult Note (Signed)
Urology Consult   Reason for consult: left distal ureteral stone in the setting of possible UTI  History of Present Illness: [name] is a 67 y.o. M who presented to the ED at [facility] last night c/o acute left flank pain, N/V. He also described some subjective fever symptoms at that time  CT scan obtained shows left UVJ ureteral stone ~5 mm with associated moderate hydronephrosis. UA nitrite positive although patient has indwelling foley (which was changed 5/21). Creatinine increased in ED as well.   Patient has hx of BPH with urinary retention and is scheduled to undergo HoLEP with Dr Richardo Hanks on Friday.   Today [name] is feeling OK. He does report the pain seemed to improve this morning around 4  Past Medical History:  Diagnosis Date   A-fib [facility])    a.) CHA2DS2VASc = 2 (age, vascular disease history);  b.) rate/rhythm maintained on oral metoprolol tartrate; no chronic anticoagulation   Adenomatous polyps    Aortic atherosclerosis (HCC)    Bladder stones    BPH with urinary obstruction    Complication of anesthesia    a.) acute postoperative urinary retention in 07/2022 following foot surgery at [facility] --> required visit to ED and placement of foley catheter x 2 months   Erectile dysfunction    a.) on PDE5i (sildenafil)   Family history of colon cancer    Foot lesion    Hepatic steatosis    History of kidney stones    Liver hemangioma    OSA (obstructive sleep apnea) 10/01/2022   a.) s/p PSG 10/01/2022 --> moderate sleep apnea (AHI 15.7/hr); awaiting CPAP machine   S/P dilatation of esophageal stricture    Syncope and collapse 08/2022   a.) s/p initiation of flomax + finasteride  --> fell and hit head sustaining laceration that ultimately required skin closure with staples    Past Surgical History:  Procedure Laterality Date   BACK SURGERY  2003   lumbar-ruptured disc   COLONOSCOPY  06/23/2011   Procedure: COLONOSCOPY;  Surgeon: Malissa Hippo, MD;  [facility];  Service: Endoscopy;  Laterality: N/A;  3:00   COLONOSCOPY N/A 07/15/2016   Procedure: COLONOSCOPY;  Surgeon: Malissa Hippo, MD;  [facility];  Service: Endoscopy;  Laterality: N/A;  830   COLONOSCOPY WITH PROPOFOL N/A 08/27/2021   Procedure: COLONOSCOPY WITH PROPOFOL;  Surgeon: Malissa Hippo, MD;  [facility];  Service: Endoscopy;  Laterality: N/A;  730   ESOPHAGOGASTRODUODENOSCOPY     left lytic foot lesion, left cuboid Left 07/2022   [facility]    Current Hospital Medications:  Home Meds:  No current facility-administered medications on file prior to encounter.   Current Outpatient Medications on File Prior to Encounter  Medication Sig Dispense Refill   finasteride (PROSCAR) 5 MG tablet Take 1 tablet (5 mg total) by mouth daily. (Patient taking differently: Take 5 mg by mouth every morning.) 30 tablet 11   metoprolol tartrate (LOPRESSOR) 25 MG tablet Take 0.5 tablets (12.5 mg total) by mouth 2 (two) times daily. 90 tablet 3   Multiple Vitamin (MULTIVITAMIN) tablet Take 1 tablet by mouth daily.      ondansetron (ZOFRAN-ODT) 8 MG disintegrating tablet Take 1 tablet (8 mg total) by mouth every 8 (eight) hours as needed for nausea. 20 tablet 0   oxyCODONE (OXY IR/ROXICODONE) 5 MG immediate release tablet Take 5 mg by mouth every 4 (four) hours as needed.  sildenafil (VIAGRA) 100 MG tablet 1/2 to 1 tab po prn (Patient taking differently: Take 50-100 mg by mouth as needed for erectile dysfunction. 1/2 to 1 tab po prn) 30 tablet 99   tamsulosin (FLOMAX) 0.4 MG CAPS capsule Take 1 capsule (0.4 mg total) by mouth daily after supper. (Patient taking differently: Take 0.4 mg by mouth daily after breakfast.) 90 capsule 11     Scheduled Meds:  chlorhexidine  15 mL Mouth/Throat Once   Or   mouth rinse  15 mL Mouth Rinse Once   [MAR Hold] diphenhydrAMINE  25 mg Intravenous Once   [MAR Hold] finasteride  5 mg Oral Daily   [MAR Hold] heparin  5,000 Units  Subcutaneous Q8H   [MAR Hold] metoprolol tartrate  12.5 mg Oral BID   scopolamine       [MAR Hold] tamsulosin  0.4 mg Oral Daily   Continuous Infusions:  sodium chloride 100 mL/hr at 10/16/22 0105   acetaminophen     ceFAZolin      ceFAZolin (ANCEF) IV     [MAR Hold] cefTRIAXone (ROCEPHIN)  IV     lactated ringers     PRN Meds:.acetaminophen, [MAR Hold] acetaminophen **OR** [MAR Hold] acetaminophen, ceFAZolin, [MAR Hold] morphine (PF), [MAR Hold] ondansetron **OR** [MAR Hold] ondansetron (ZOFRAN) IV, [MAR Hold] oxyCODONE, scopolamine  Allergies: No Known Allergies  Family History  Problem Relation Age of Onset   Cancer Father     Social History:  reports that he has never smoked. He has never been exposed to tobacco smoke. He has never used smokeless tobacco. He reports that he does not drink alcohol and does not use drugs.  ROS: A complete review of systems was performed.  All systems are negative except for pertinent findings as noted.  Physical Exam:  Vital signs in last 24 hours: Temp:  [97.8 F (36.6 C)-98.2 F (36.8 C)] 97.9 F (36.6 C) (05/25 0759) Pulse Rate:  [71-100] 86 (05/25 0843) Resp:  [16-20] 16 (05/25 0843) BP: (109-174)/(73-99) 131/98 (05/25 0843) SpO2:  [99 %-100 %] 100 % (05/25 0759) Weight:  [97.5 kg-99.2 kg] 99.2 kg (05/25 0037) Constitutional:  Alert and oriented, No acute distress Cardiovascular: Regular rate and rhythm Respiratory: Normal respiratory effort, Lungs clear bilaterally GI: Abdomen is soft, nontender, nondistended, no abdominal masses GU: No CVA tenderness Neurologic: Grossly intact, no focal deficits Psychiatric: Normal mood and affect  Laboratory Data:  Recent Labs    10/15/22 1959 10/16/22 0540  WBC 11.3* 12.3*  HGB 15.3 14.5  HCT 45.4 43.4  PLT 239 228    Recent Labs    10/15/22 1959 10/16/22 0540  NA 138 138  K 4.4 4.5  CL 102 105  GLUCOSE 183* 134*  BUN 19 19  CALCIUM 9.3 8.8*  CREATININE 1.62* 1.53*      Results for orders placed or performed during the hospital encounter of 10/15/22 (from the past 24 hour(s))  Basic metabolic panel     Status: Abnormal   Collection Time: 10/15/22  7:59 PM  Result Value Ref Range   Sodium 138 135 - 145 mmol/L   Potassium 4.4 3.5 - 5.1 mmol/L   Chloride 102 98 - 111 mmol/L   CO2 21 (L) 22 - 32 mmol/L   Glucose, Bld 183 (H) 70 - 99 mg/dL   BUN 19 8 - 23 mg/dL   Creatinine, Ser 2.13 (H) 0.61 - 1.24 mg/dL   Calcium 9.3 8.9 - 08.6 mg/dL   GFR, Estimated 47 (L) >60 mL/min  Anion gap 15 5 - 15  CBC     Status: Abnormal   Collection Time: 10/15/22  7:59 PM  Result Value Ref Range   WBC 11.3 (H) 4.0 - 10.5 K/uL   RBC 5.19 4.22 - 5.81 MIL/uL   Hemoglobin 15.3 13.0 - 17.0 g/dL   HCT 04.5 40.9 - 81.1 %   MCV 87.5 80.0 - 100.0 fL   MCH 29.5 26.0 - 34.0 pg   MCHC 33.7 30.0 - 36.0 g/dL   RDW 91.4 78.2 - 95.6 %   Platelets 239 150 - 400 K/uL   nRBC 0.0 0.0 - 0.2 %  Urinalysis, Routine w reflex microscopic -Urine, Catheterized; Indwelling urinary catheter     Status: Abnormal   Collection Time: 10/15/22  8:25 PM  Result Value Ref Range   Color, Urine YELLOW YELLOW   APPearance CLOUDY (A) CLEAR   Specific Gravity, Urine 1.021 1.005 - 1.030   pH 6.0 5.0 - 8.0   Glucose, UA NEGATIVE NEGATIVE mg/dL   Hgb urine dipstick LARGE (A) NEGATIVE   Bilirubin Urine NEGATIVE NEGATIVE   Ketones, ur NEGATIVE NEGATIVE mg/dL   Protein, ur 213 (A) NEGATIVE mg/dL   Nitrite POSITIVE (A) NEGATIVE   Leukocytes,Ua LARGE (A) NEGATIVE   RBC / HPF >50 0 - 5 RBC/hpf   WBC, UA >50 0 - 5 WBC/hpf   Bacteria, UA MANY (A) NONE SEEN   Squamous Epithelial / HPF 0-5 0 - 5 /HPF   WBC Clumps PRESENT    Mucus PRESENT   Comprehensive metabolic panel     Status: Abnormal   Collection Time: 10/16/22  5:40 AM  Result Value Ref Range   Sodium 138 135 - 145 mmol/L   Potassium 4.5 3.5 - 5.1 mmol/L   Chloride 105 98 - 111 mmol/L   CO2 26 22 - 32 mmol/L   Glucose, Bld 134 (H) 70 - 99  mg/dL   BUN 19 8 - 23 mg/dL   Creatinine, Ser 0.86 (H) 0.61 - 1.24 mg/dL   Calcium 8.8 (L) 8.9 - 10.3 mg/dL   Total Protein 7.4 6.5 - 8.1 g/dL   Albumin 3.9 3.5 - 5.0 g/dL   AST 23 15 - 41 U/L   ALT 24 0 - 44 U/L   Alkaline Phosphatase 63 38 - 126 U/L   Total Bilirubin 0.7 0.3 - 1.2 mg/dL   GFR, Estimated 50 (L) >60 mL/min   Anion gap 7 5 - 15  Magnesium     Status: None   Collection Time: 10/16/22  5:40 AM  Result Value Ref Range   Magnesium 2.0 1.7 - 2.4 mg/dL  CBC with Differential/Platelet     Status: Abnormal   Collection Time: 10/16/22  5:40 AM  Result Value Ref Range   WBC 12.3 (H) 4.0 - 10.5 K/uL   RBC 4.94 4.22 - 5.81 MIL/uL   Hemoglobin 14.5 13.0 - 17.0 g/dL   HCT 57.8 46.9 - 62.9 %   MCV 87.9 80.0 - 100.0 fL   MCH 29.4 26.0 - 34.0 pg   MCHC 33.4 30.0 - 36.0 g/dL   RDW 52.8 41.3 - 24.4 %   Platelets 228 150 - 400 K/uL   nRBC 0.0 0.0 - 0.2 %   Neutrophils Relative % 84 %   Neutro Abs 10.3 (H) 1.7 - 7.7 K/uL   Lymphocytes Relative 9 %   Lymphs Abs 1.1 0.7 - 4.0 K/uL   Monocytes Relative 6 %   Monocytes Absolute 0.8 0.1 -  1.0 K/uL   Eosinophils Relative 0 %   Eosinophils Absolute 0.0 0.0 - 0.5 K/uL   Basophils Relative 0 %   Basophils Absolute 0.0 0.0 - 0.1 K/uL   Immature Granulocytes 1 %   Abs Immature Granulocytes 0.06 0.00 - 0.07 K/uL   No results found for this or any previous visit (from the past 240 hour(s)).  Renal Function: Recent Labs    10/15/22 1959 10/16/22 0540  CREATININE 1.62* 1.53*   Estimated Creatinine Clearance: 59.8 mL/min (A) (by C-G formula based on SCr of 1.53 mg/dL (H)).  Radiologic Imaging: CT ABDOMEN PELVIS W CONTRAST  Result Date: 10/15/2022 CLINICAL DATA:  Left flank pain. EXAM: CT ABDOMEN AND PELVIS WITH CONTRAST TECHNIQUE: Multidetector CT imaging of the abdomen and pelvis was performed using the standard protocol following bolus administration of intravenous contrast. RADIATION DOSE REDUCTION: This exam was performed  according to the departmental dose-optimization program which includes automated exposure control, adjustment of the mA and/or kV according to patient size and/or use of iterative reconstruction technique. CONTRAST:  OMNIPAQUE IOHEXOL 300 MG/ML  SOLN COMPARISON:  09/09/2022, 09/17/2022. FINDINGS: Lower chest: Mild atelectasis at the lung bases. Hepatobiliary: There is a 9.0 x 6.7 cm hypodensity in the posterior right lobe of the liver with peripheral puddling of contrast, suggesting hemangioma. No biliary ductal dilatation. The gallbladder is without stones. Pancreas: Unremarkable. No pancreatic ductal dilatation or surrounding inflammatory changes. Spleen: Normal in size without focal abnormality. Adrenals/Urinary Tract: The adrenal glands are within normal limits. Cysts are noted in the right kidney. There are subcentimeter hypodensities in the left kidney which are too small to further characterize. A slightly delayed nephrogram is noted on the left. There is a nonobstructive right renal calculus. No renal calculus on the left. Moderate obstructive uropathy is present on the left with a 5 mm calculus in the region of the UVJ on the left. Perinephric and periureteral fat stranding are noted on the left. Multiple calcifications are present in the urinary bladder on the right. There is diffuse bladder wall thickening and a Foley catheter is in place. Stomach/Bowel: Stomach is within normal limits. Appendix appears normal. No evidence of bowel wall thickening, distention, or inflammatory changes. No free air or pneumatosis. Scattered diverticula are present along the colon without evidence of diverticulitis. Vascular/Lymphatic: Aortic atherosclerosis. No enlarged abdominal or pelvic lymph nodes. Reproductive: The prostate gland is enlarged. Other: No abdominopelvic ascites. A fat containing inguinal hernia is noted on the left. There is a small fat containing umbilical hernia. Musculoskeletal: Degenerative  changes are present in the thoracolumbar spine. No acute osseous abnormality is seen. IMPRESSION: 1. Moderate obstructive uropathy on the left with a 5 mm calculus in the bladder on the left in the region of the ureterovesicular junction suggesting recently passed stone. There is a delayed nephrogram on the left with perinephric and periureteral fat stranding, which may be infectious or inflammatory. 2. Diffuse bladder wall thickening, suggestive of infectious or inflammatory cystitis. Multiple bladder calculi are noted. 3. Nonobstructive right renal calculus. 4. Enlarged prostate gland. 5. Hypodense lesion in the posterior right lobe of the liver with peripheral puddling of contrast, compatible with hemangioma. 6. Diverticulosis without diverticulitis. 7. Aortic atherosclerosis. Electronically Signed   By: Thornell Sartorius M.D.   On: 10/15/2022 21:33    I independently reviewed the above imaging studies.  Impression/Recommendation 67 yo M with distal left ureteral stone in the setting of a possible UTI.  --to OR for stent placement vs stone extraction.  Reviewed risks of the procedure, including hematuria, clot retention, infection, damage to GU tract, and inability to complete the procedure. We discussed it is possible he has passed the stone as well. Patient voiced understanding and wishes to proceed  Irine Seal MD 10/16/2022, 8:54 AM  [facility]  Pager: [phone]

## 2022-10-16 NOTE — Transfer of Care (Signed)
Immediate Anesthesia Transfer of Care Note  Patient: [name]  Procedure(s) Performed: CYSTOSCOPY WITH STENT PLACEMENT (Left: Ureter)  Patient Location: PACU  Anesthesia Type:General  Level of Consciousness: drowsy and patient cooperative  Airway & Oxygen Therapy: Patient Spontanous Breathing and Patient connected to face mask oxygen  Post-op Assessment: Report given to RN and Post -op Vital signs reviewed and stable  Post vital signs: Reviewed and stable  Last Vitals:  Vitals Value Taken Time  BP    Temp    Pulse    Resp    SpO2      Last Pain:  Vitals:   10/16/22 0759  TempSrc: Oral  PainSc:       Patients Stated Pain Goal: 0 (10/16/22 0023)  Complications: No notable events documented.

## 2022-10-16 NOTE — Anesthesia Procedure Notes (Signed)
Procedure Name: LMA Insertion Date/Time: 10/16/2022 9:05 AM  Performed by: Epimenio Sarin, CRNAPre-anesthesia Checklist: Patient identified, Emergency Drugs available, Suction available, Patient being monitored and Timeout performed Patient Re-evaluated:Patient Re-evaluated prior to induction Oxygen Delivery Method: Circle system utilized Preoxygenation: Pre-oxygenation with 100% oxygen Induction Type: IV induction Ventilation: Mask ventilation without difficulty LMA: LMA with gastric port inserted LMA Size: 4.0 Number of attempts: 1 Dental Injury: Teeth and Oropharynx as per pre-operative assessment

## 2022-10-16 NOTE — Anesthesia Postprocedure Evaluation (Signed)
Anesthesia Post Note  Patient: [name]  Procedure(s) Performed: CYSTOSCOPY WITH STENT PLACEMENT (Left: Ureter)     Patient location during evaluation: PACU Anesthesia Type: General Level of consciousness: sedated Pain management: pain level controlled Vital Signs Assessment: post-procedure vital signs reviewed and stable Respiratory status: spontaneous breathing and respiratory function stable Cardiovascular status: stable Postop Assessment: no apparent nausea or vomiting Anesthetic complications: no  No notable events documented.  Last Vitals:  Vitals:   10/16/22 1000 10/16/22 1009  BP: 123/74   Pulse: 82 80  Resp: 11 14  Temp:    SpO2: 97% 94%    Last Pain:  Vitals:   10/16/22 1009  TempSrc:   PainSc: 0-No pain                 Lemya Greenwell DANIEL

## 2022-10-16 NOTE — Progress Notes (Signed)
Pt arrived to unit. Vitals stable. Notified provider.  

## 2022-10-16 NOTE — Op Note (Signed)
Operative Note  Preoperative diagnosis:  1.  Obstructing left ureteral stone 2. Possible UTI 3. Enlarged prostate with urinary retention  Postoperative diagnosis: 1.  same  Procedure(s): 1.  Cystoscopy 2. Left  retrograde pyelogram with interpretation 3. Left  ureteral stent placement 6x28 cm 4. Fluoroscopy <1 hour with intraoperative interpretation  Surgeon: Irine Seal, MD  Assistants:  None  Anesthesia:  General  Complications:  None  EBL:  5 cc  Specimens: 1. none  Drains/Catheters: 1.  Left 6Fr x 28cm ureteral stent  Intraoperative findings:   Cystoscopy demonstrated multiple bladder stones and markedly enlarged prostate Left Retrograde pyelogram demonstrated hydronephrosis. Successful left ureteral stent placement with curl in the renal pelvis and bladder respectively.  Indication:  [name] is a 67 y.o. male with the above dx. After reviewing the management options for treatment, he elected to proceed with the above surgical procedure(s). We have discussed the potential benefits and risks of the procedure, side effects of the proposed treatment, the likelihood of the patient achieving the goals of the procedure, and any potential problems that might occur during the procedure or recuperation. Informed consent has been obtained.  Description of procedure: The patient was taken to the operating room and general anesthesia was induced.  The patient was placed in the dorsal lithotomy position, prepped and draped in the usual sterile fashion, and preoperative antibiotics were administered. A preoperative time-out was performed.   Cystourethroscopy was performed.  The patient's urethra was examined there was marked prostate hypertrophy The bladder was then systematically examined in its entirety. There was no evidence for any bladder tumors, stones, or other mucosal pathology.    Attention then turned to the left ureteral orifice. A 0.038 zip wire was passed through  the left orifice and over the wire a 5 Fr open ended catheter was inserted into the left distal ureter - after a few cm it met resistance, consistent with the presence of a stone. Omnipaque contrast was injected through the ureteral catheter and a retrograde pyelogram was performed with findings as dictated above. The wire was then replaced and the open ended catheter was removed.   A 6Fr x 28cm ureteral stent was advance over the wire. The stent was positioned appropriately under fluoroscopic and cystoscopic guidance.  The wire was then removed with an adequate stent curl noted in the renal pelvis as well as in the bladder.  The bladder was then emptied and the procedure ended.  I replaced an 18 fr coude catheter with return of clear pink urine. The patient appeared to tolerate the procedure well and without complications.  The patient was able to be awakened and transferred to the recovery unit in satisfactory condition.   Plan:  OK to d/c home from GU perspective. Will message Dr Richardo Hanks that I placed a stent today  Neva Seat MD [facility]  Pager: [phone]

## 2022-10-16 NOTE — Discharge Summary (Signed)
Physician Discharge Summary   [name] UJW:[id number] DOB: 11-May-1956 DOA: 10/15/2022  PCP: Aliene Beams, MD  Admit date: 10/15/2022 Discharge date: 10/16/2022  Admitted From: Home Disposition:  Home Discharging physician: Lewie Chamber, MD Barriers to discharge: none  Recommendations at discharge: Follow up with urology Repeat BMP   Discharge Condition: stable CODE STATUS: Full Diet recommendation:  Diet Orders (From admission, onward)     Start     Ordered   10/16/22 1118  Diet regular Fluid consistency: Thin  Diet effective now       Question:  Fluid consistency:  Answer:  Thin   10/16/22 1117   10/16/22 0000  Diet general        10/16/22 1120            Hospital Course: No notes on file  Assessment and Plan: * Obstructive uropathy - per CT: "Moderate obstructive uropathy on the left with a 5 mm calculus in the bladder on the left in the region of the ureterovesicular junction suggesting recently passed stone. There is a delayed nephrogram on the left with perinephric and periureteral fat stranding, which may be infectious or inflammatory. Diffuse bladder wall thickening, suggestive of infectious or inflammatory cystitis. Multiple bladder calculi are noted. Nonobstructive right renal calculus." -Evaluated by urology and patient underwent cystoscopy with left ureteral stent placement on 10/16/2022 -UA noted with large LE, positive nitrite, many bacteria, greater than 50 WBC - Rocephin started on admission.  Patient discharged on 7-day course of Omnicef -Urine culture in progress at time of discharge  Acute pyelonephritis - Left perinephric stranding noted on CT - UA consistent with infection - Urine culture pending at discharge - Continued on Omnicef course at discharge  AKI (acute kidney injury) (HCC) - Creatinine increased from 1.19>> 1.62 - Secondary to obstructive uropathy -Underwent fluid resuscitation as well - Creatinine showed some improvement,  1.53 and should continue to improve after stent placement - Repeat BMP at follow-up  BPH (benign prostatic hyperplasia) - Continue Proscar and tamsulosin - Indwelling Foley swapped out  Paroxysmal A-fib (HCC) - Continue metoprolol -No anticoagulation noted   The patient's chronic medical conditions were treated accordingly per the patient's home medication regimen except as noted.  On day of discharge, patient was felt deemed stable for discharge. Patient/family member advised to call PCP or come back to ER if needed.   Principal Diagnosis: Obstructive uropathy  Discharge Diagnoses: Active Hospital Problems   Diagnosis Date Noted   Obstructive uropathy 10/15/2022    Priority: 1.   AKI (acute kidney injury) (HCC) 10/15/2022    Priority: 2.   Acute pyelonephritis 10/15/2022    Priority: 2.   BPH (benign prostatic hyperplasia) 10/15/2022   Paroxysmal A-fib (HCC) 09/22/2022    Resolved Hospital Problems  No resolved problems to display.     Discharge Instructions     Diet general   Complete by: As directed    Increase activity slowly   Complete by: As directed       Allergies as of 10/16/2022   No Known Allergies      Medication List     STOP taking these medications    cephALEXin 500 MG capsule Commonly known as: Keflex   oxyCODONE 5 MG immediate release tablet Commonly known as: Oxy IR/ROXICODONE       TAKE these medications    cefdinir 300 MG capsule Commonly known as: OMNICEF Take 1 capsule (300 mg total) by mouth 2 (two) times daily for 7 days.  finasteride 5 MG tablet Commonly known as: PROSCAR Take 1 tablet (5 mg total) by mouth daily. What changed: when to take this   metoprolol tartrate 25 MG tablet Commonly known as: LOPRESSOR Take 0.5 tablets (12.5 mg total) by mouth 2 (two) times daily.   multivitamin tablet Take 1 tablet by mouth daily.   ondansetron 8 MG disintegrating tablet Commonly known as: ZOFRAN-ODT Take 1 tablet (8 mg  total) by mouth every 8 (eight) hours as needed for nausea.   sildenafil 100 MG tablet Commonly known as: VIAGRA 1/2 to 1 tab po prn What changed:  how much to take how to take this when to take this reasons to take this   tamsulosin 0.4 MG Caps capsule Commonly known as: FLOMAX Take 1 capsule (0.4 mg total) by mouth daily after supper. What changed: when to take this   traMADol 50 MG tablet Commonly known as: ULTRAM Take 1 tablet (50 mg total) by mouth every 6 (six) hours as needed for severe pain.        No Known Allergies  Consultations: Urology  Procedures: 10/16/2022: Cystoscopy and left ureteral stent placement  Discharge Exam: BP 130/74 (BP Location: Left Arm)   Pulse 79   Temp 97.9 F (36.6 C) (Oral)   Resp 16   Ht 6\' 2"  (1.88 m)   Wt 99.2 kg   SpO2 95%   BMI 28.08 kg/m  Physical Exam Constitutional:      General: He is not in acute distress.    Appearance: Normal appearance.  HENT:     Head: Normocephalic and atraumatic.     Mouth/Throat:     Mouth: Mucous membranes are moist.  Eyes:     Extraocular Movements: Extraocular movements intact.  Cardiovascular:     Rate and Rhythm: Normal rate and regular rhythm.  Pulmonary:     Effort: Pulmonary effort is normal. No respiratory distress.     Breath sounds: Normal breath sounds. No wheezing.  Abdominal:     General: Bowel sounds are normal. There is no distension.     Palpations: Abdomen is soft.     Tenderness: There is no abdominal tenderness.  Musculoskeletal:        General: Normal range of motion.     Cervical back: Normal range of motion and neck supple.  Skin:    General: Skin is warm and dry.  Neurological:     General: No focal deficit present.     Mental Status: He is alert.  Psychiatric:        Mood and Affect: Mood normal.        Behavior: Behavior normal.      The results of significant diagnostics from this hospitalization (including imaging, microbiology, ancillary and  laboratory) are listed below for reference.   Microbiology: No results found for this or any previous visit (from the past 240 hour(s)).   Labs: BNP (last 3 results) No results for input(s): "BNP" in the last 8760 hours. Basic Metabolic Panel: Recent Labs  Lab 10/15/22 1959 10/16/22 0540  NA 138 138  K 4.4 4.5  CL 102 105  CO2 21* 26  GLUCOSE 183* 134*  BUN 19 19  CREATININE 1.62* 1.53*  CALCIUM 9.3 8.8*  MG  --  2.0   Liver Function Tests: Recent Labs  Lab 10/16/22 0540  AST 23  ALT 24  ALKPHOS 63  BILITOT 0.7  PROT 7.4  ALBUMIN 3.9   No results for input(s): "LIPASE", "AMYLASE" in the  last 168 hours. No results for input(s): "AMMONIA" in the last 168 hours. CBC: Recent Labs  Lab 10/15/22 1959 10/16/22 0540  WBC 11.3* 12.3*  NEUTROABS  --  10.3*  HGB 15.3 14.5  HCT 45.4 43.4  MCV 87.5 87.9  PLT 239 228   Cardiac Enzymes: No results for input(s): "CKTOTAL", "CKMB", "CKMBINDEX", "TROPONINI" in the last 168 hours. BNP: Invalid input(s): "POCBNP" CBG: No results for input(s): "GLUCAP" in the last 168 hours. D-Dimer No results for input(s): "DDIMER" in the last 72 hours. Hgb A1c No results for input(s): "HGBA1C" in the last 72 hours. Lipid Profile No results for input(s): "CHOL", "HDL", "LDLCALC", "TRIG", "CHOLHDL", "LDLDIRECT" in the last 72 hours. Thyroid function studies No results for input(s): "TSH", "T4TOTAL", "T3FREE", "THYROIDAB" in the last 72 hours.  Invalid input(s): "FREET3" Anemia work up No results for input(s): "VITAMINB12", "FOLATE", "FERRITIN", "TIBC", "IRON", "RETICCTPCT" in the last 72 hours. Urinalysis    Component Value Date/Time   COLORURINE YELLOW 10/15/2022 2025   APPEARANCEUR CLOUDY (A) 10/15/2022 2025   APPEARANCEUR Clear 01/26/2022 1449   LABSPEC 1.021 10/15/2022 2025   PHURINE 6.0 10/15/2022 2025   GLUCOSEU NEGATIVE 10/15/2022 2025   HGBUR LARGE (A) 10/15/2022 2025   BILIRUBINUR NEGATIVE 10/15/2022 2025   BILIRUBINUR  Negative 01/26/2022 1449   KETONESUR NEGATIVE 10/15/2022 2025   PROTEINUR 100 (A) 10/15/2022 2025   NITRITE POSITIVE (A) 10/15/2022 2025   LEUKOCYTESUR LARGE (A) 10/15/2022 2025   Sepsis Labs Recent Labs  Lab 10/15/22 1959 10/16/22 0540  WBC 11.3* 12.3*   Microbiology No results found for this or any previous visit (from the past 240 hour(s)).  Procedures/Studies: DG C-Arm 1-60 Min-No Report  Result Date: 10/16/2022 Fluoroscopy was utilized by the requesting physician.  No radiographic interpretation.   CT ABDOMEN PELVIS W CONTRAST  Result Date: 10/15/2022 CLINICAL DATA:  Left flank pain. EXAM: CT ABDOMEN AND PELVIS WITH CONTRAST TECHNIQUE: Multidetector CT imaging of the abdomen and pelvis was performed using the standard protocol following bolus administration of intravenous contrast. RADIATION DOSE REDUCTION: This exam was performed according to the departmental dose-optimization program which includes automated exposure control, adjustment of the mA and/or kV according to patient size and/or use of iterative reconstruction technique. CONTRAST:  OMNIPAQUE IOHEXOL 300 MG/ML  SOLN COMPARISON:  09/09/2022, 09/17/2022. FINDINGS: Lower chest: Mild atelectasis at the lung bases. Hepatobiliary: There is a 9.0 x 6.7 cm hypodensity in the posterior right lobe of the liver with peripheral puddling of contrast, suggesting hemangioma. No biliary ductal dilatation. The gallbladder is without stones. Pancreas: Unremarkable. No pancreatic ductal dilatation or surrounding inflammatory changes. Spleen: Normal in size without focal abnormality. Adrenals/Urinary Tract: The adrenal glands are within normal limits. Cysts are noted in the right kidney. There are subcentimeter hypodensities in the left kidney which are too small to further characterize. A slightly delayed nephrogram is noted on the left. There is a nonobstructive right renal calculus. No renal calculus on the left. Moderate obstructive  uropathy is present on the left with a 5 mm calculus in the region of the UVJ on the left. Perinephric and periureteral fat stranding are noted on the left. Multiple calcifications are present in the urinary bladder on the right. There is diffuse bladder wall thickening and a Foley catheter is in place. Stomach/Bowel: Stomach is within normal limits. Appendix appears normal. No evidence of bowel wall thickening, distention, or inflammatory changes. No free air or pneumatosis. Scattered diverticula are present along the colon without evidence of diverticulitis.  Vascular/Lymphatic: Aortic atherosclerosis. No enlarged abdominal or pelvic lymph nodes. Reproductive: The prostate gland is enlarged. Other: No abdominopelvic ascites. A fat containing inguinal hernia is noted on the left. There is a small fat containing umbilical hernia. Musculoskeletal: Degenerative changes are present in the thoracolumbar spine. No acute osseous abnormality is seen. IMPRESSION: 1. Moderate obstructive uropathy on the left with a 5 mm calculus in the bladder on the left in the region of the ureterovesicular junction suggesting recently passed stone. There is a delayed nephrogram on the left with perinephric and periureteral fat stranding, which may be infectious or inflammatory. 2. Diffuse bladder wall thickening, suggestive of infectious or inflammatory cystitis. Multiple bladder calculi are noted. 3. Nonobstructive right renal calculus. 4. Enlarged prostate gland. 5. Hypodense lesion in the posterior right lobe of the liver with peripheral puddling of contrast, compatible with hemangioma. 6. Diverticulosis without diverticulitis. 7. Aortic atherosclerosis. Electronically Signed   By: Thornell Sartorius M.D.   On: 10/15/2022 21:33   MR LIVER W WO CONTRAST  Result Date: 09/17/2022 CLINICAL DATA:  Liver mass on CT. No history of malignancy or prior relevant surgery. EXAM: MRI ABDOMEN WITHOUT AND WITH CONTRAST TECHNIQUE: Multiplanar  multisequence MR imaging of the abdomen was performed both before and after the administration of intravenous contrast. CONTRAST:  10 cc Vueway COMPARISON:  Noncontrast abdominopelvic CT 09/09/2022 and 01/08/2018. FINDINGS: Lower chest:  The visualized lower chest appears unremarkable. Hepatobiliary: Mild hepatic steatosis with mild loss of signal on the gradient echo opposed phase images. There are no morphologic changes of cirrhosis. Corresponding with the lesion of concern posteriorly in the right hepatic lobe on previous CT scans is a large lobulated T2 hyperintense mass which measures approximately 10.1 x 7.1 x 8.6 cm. This demonstrates progressive peripheral discontinuous enhancement following contrast, typical of a giant hemangioma. There is a probable smaller hemangioma inferiorly in the right hepatic lobe measuring 1.1 cm on image 29/8 with type 1 enhancement following contrast. No suspicious liver lesions. No evidence of gallstones, gallbladder wall thickening or biliary dilatation. Pancreas: Unremarkable. No pancreatic ductal dilatation or surrounding inflammatory changes. Spleen: Normal in size without focal abnormality. Adrenals/Urinary Tract: Both adrenal glands appear normal. No evidence of renal mass, hydronephrosis or perinephric soft tissue stranding. There are small simple appearing renal cysts bilaterally for which no follow-up imaging is recommended. Stomach/Bowel: The stomach appears unremarkable for its degree of distension. No evidence of bowel wall thickening, distention or surrounding inflammatory change. Vascular/Lymphatic: There are no enlarged abdominal lymph nodes. Mildly prominent lymph nodes in the porta hepatis are unchanged from previous studies, likely reactive. No significant vascular findings. Other: No ascites.  The visualized abdominal wall is intact. Musculoskeletal: No acute or significant osseous findings. Scattered hemangiomas are noted within the spine. IMPRESSION: 1. The  lesion of concern posteriorly in the right hepatic lobe is typical of a giant hemangioma. There is a smaller hemangioma inferiorly in the right hepatic lobe. No suspicious liver lesions. 2. No acute findings.  No residual hydronephrosis. 3. Mild hepatic steatosis. Electronically Signed   By: Carey Bullocks M.D.   On: 09/17/2022 12:49     Time coordinating discharge: Over 30 minutes    Lewie Chamber, MD  [facility] 10/16/2022, 12:38 PM

## 2022-10-16 NOTE — Plan of Care (Signed)
  Problem: Clinical Measurements: Goal: Ability to maintain clinical measurements within normal limits will improve Outcome: Progressing   Problem: Coping: Goal: Level of anxiety will decrease Outcome: Progressing   Problem: Safety: Goal: Ability to remain free from injury will improve Outcome: Progressing   

## 2022-10-16 NOTE — Anesthesia Preprocedure Evaluation (Addendum)
Anesthesia Evaluation  Patient identified by MRN, date of birth, ID band Patient awake    Reviewed: Allergy & Precautions, NPO status , Patient's Chart, lab work & pertinent test results  History of Anesthesia Complications Negative for: history of anesthetic complications  Airway Mallampati: III  TM Distance: >3 FB Neck ROM: Full    Dental no notable dental hx. (+) Dental Advisory Given   Pulmonary sleep apnea    Pulmonary exam normal        Cardiovascular Normal cardiovascular exam+ dysrhythmias Atrial Fibrillation      Neuro/Psych negative neurological ROS     GI/Hepatic negative GI ROS, Neg liver ROS,,,  Endo/Other  negative endocrine ROS    Renal/GU negative Renal ROS     Musculoskeletal negative musculoskeletal ROS (+)    Abdominal   Peds  Hematology negative hematology ROS (+)   Anesthesia Other Findings   Reproductive/Obstetrics                             Anesthesia Physical Anesthesia Plan  ASA: 3  Anesthesia Plan: General   Post-op Pain Management: Toradol IV (intra-op)* and Ofirmev IV (intra-op)*   Induction: Intravenous  PONV Risk Score and Plan: 4 or greater and Ondansetron, Dexamethasone, Midazolam and Scopolamine patch - Pre-op  Airway Management Planned: LMA  Additional Equipment:   Intra-op Plan:   Post-operative Plan: Extubation in OR  Informed Consent: I have reviewed the patients History and Physical, chart, labs and discussed the procedure including the risks, benefits and alternatives for the proposed anesthesia with the patient or authorized representative who has indicated his/her understanding and acceptance.     Dental advisory given  Plan Discussed with: Anesthesiologist and CRNA  Anesthesia Plan Comments:        Anesthesia Quick Evaluation

## 2022-10-17 ENCOUNTER — Encounter (HOSPITAL_COMMUNITY): Payer: Self-pay | Admitting: Urology

## 2022-10-17 LAB — URINE CULTURE

## 2022-10-22 ENCOUNTER — Ambulatory Visit: Payer: Medicare Other | Admitting: Urgent Care

## 2022-10-22 ENCOUNTER — Encounter: Payer: Self-pay | Admitting: Urology

## 2022-10-22 ENCOUNTER — Other Ambulatory Visit: Payer: Self-pay

## 2022-10-22 ENCOUNTER — Encounter
Admission: RE | Disposition: A | Discharge status: Home / Self Care | Payer: Self-pay | Source: Home / Self Care | Attending: Urology

## 2022-10-22 ENCOUNTER — Ambulatory Visit: Payer: Medicare Other

## 2022-10-22 ENCOUNTER — Ambulatory Visit
Admission: RE | Admit: 2022-10-22 | Discharge: 2022-10-22 | Disposition: A | Discharge status: Home / Self Care | Payer: Medicare Other | Attending: Urology | Admitting: Urology

## 2022-10-22 DIAGNOSIS — R338 Other retention of urine: Secondary | ICD-10-CM | POA: Diagnosis not present

## 2022-10-22 DIAGNOSIS — N201 Calculus of ureter: Secondary | ICD-10-CM | POA: Insufficient documentation

## 2022-10-22 DIAGNOSIS — N401 Enlarged prostate with lower urinary tract symptoms: Secondary | ICD-10-CM | POA: Diagnosis not present

## 2022-10-22 DIAGNOSIS — N318 Other neuromuscular dysfunction of bladder: Secondary | ICD-10-CM | POA: Diagnosis not present

## 2022-10-22 DIAGNOSIS — N210 Calculus in bladder: Secondary | ICD-10-CM | POA: Insufficient documentation

## 2022-10-22 DIAGNOSIS — N138 Other obstructive and reflux uropathy: Secondary | ICD-10-CM | POA: Diagnosis not present

## 2022-10-22 HISTORY — PX: CYSTOSCOPY WITH LITHOLAPAXY: SHX1425

## 2022-10-22 HISTORY — DX: Family history of malignant neoplasm of digestive organs: Z80.0

## 2022-10-22 HISTORY — DX: Benign neoplasm, unspecified site: D36.9

## 2022-10-22 HISTORY — DX: Atherosclerosis of aorta: I70.0

## 2022-10-22 HISTORY — DX: Fatty (change of) liver, not elsewhere classified: K76.0

## 2022-10-22 HISTORY — PX: HOLEP-LASER ENUCLEATION OF THE PROSTATE WITH MORCELLATION: SHX6641

## 2022-10-22 HISTORY — DX: Male erectile dysfunction, unspecified: N52.9

## 2022-10-22 HISTORY — PX: URETEROSCOPY: SHX842

## 2022-10-22 SURGERY — ENUCLEATION, PROSTATE, USING LASER, WITH MORCELLATION
Anesthesia: General

## 2022-10-22 MED ORDER — LIDOCAINE HCL (CARDIAC) PF 100 MG/5ML IV SOSY
PREFILLED_SYRINGE | INTRAVENOUS | Status: DC | PRN
  Administered 2022-10-22: 100 mg via INTRAVENOUS

## 2022-10-22 MED ORDER — FENTANYL CITRATE (PF) 100 MCG/2ML IJ SOLN
INTRAMUSCULAR | Status: AC
  Filled 2022-10-22: qty 2

## 2022-10-22 MED ORDER — CEFAZOLIN SODIUM-DEXTROSE 2-4 GM/100ML-% IV SOLN
2.0000 g | INTRAVENOUS | Status: AC
  Administered 2022-10-22: 2 g via INTRAVENOUS

## 2022-10-22 MED ORDER — CHLORHEXIDINE GLUCONATE CLOTH 2 % EX PADS: 6.0000 | MEDICATED_PAD | Freq: Once | CUTANEOUS | Status: DC

## 2022-10-22 MED ORDER — OXYCODONE HCL 5 MG PO TABS
ORAL_TABLET | ORAL | Status: AC
  Filled 2022-10-22: qty 1

## 2022-10-22 MED ORDER — FAMOTIDINE 20 MG PO TABS
ORAL_TABLET | ORAL | Status: AC
  Filled 2022-10-22: qty 1

## 2022-10-22 MED ORDER — DROPERIDOL 2.5 MG/ML IJ SOLN
0.6250 mg | INTRAMUSCULAR | Status: AC
  Administered 2022-10-22: 0.625 mg via INTRAVENOUS

## 2022-10-22 MED ORDER — SEVOFLURANE IN SOLN
RESPIRATORY_TRACT | Status: AC
  Filled 2022-10-22: qty 250

## 2022-10-22 MED ORDER — IOHEXOL 180 MG/ML  SOLN
INTRAMUSCULAR | Status: DC | PRN
  Administered 2022-10-22: 10 mL

## 2022-10-22 MED ORDER — FENTANYL CITRATE (PF) 100 MCG/2ML IJ SOLN
INTRAMUSCULAR | Status: DC | PRN
  Administered 2022-10-22 (×2): 50 ug via INTRAVENOUS

## 2022-10-22 MED ORDER — DEXAMETHASONE SODIUM PHOSPHATE 10 MG/ML IJ SOLN
INTRAMUSCULAR | Status: DC | PRN
  Administered 2022-10-22: 10 mg via INTRAVENOUS

## 2022-10-22 MED ORDER — SUGAMMADEX SODIUM 200 MG/2ML IV SOLN
INTRAVENOUS | Status: DC | PRN
  Administered 2022-10-22: 200 mg via INTRAVENOUS
  Administered 2022-10-22: 100 mg via INTRAVENOUS

## 2022-10-22 MED ORDER — SODIUM CHLORIDE 0.9 % IR SOLN
Status: DC | PRN
  Administered 2022-10-22 (×2): 12000 mL via INTRAVESICAL

## 2022-10-22 MED ORDER — MIDAZOLAM HCL 2 MG/2ML IJ SOLN
INTRAMUSCULAR | Status: AC
  Filled 2022-10-22: qty 2

## 2022-10-22 MED ORDER — FENTANYL CITRATE (PF) 100 MCG/2ML IJ SOLN
25.0000 ug | INTRAMUSCULAR | Status: DC | PRN
  Administered 2022-10-22: 25 ug via INTRAVENOUS

## 2022-10-22 MED ORDER — FAMOTIDINE 20 MG PO TABS
20.0000 mg | ORAL_TABLET | Freq: Once | ORAL | Status: AC
  Administered 2022-10-22: 20 mg via ORAL

## 2022-10-22 MED ORDER — ONDANSETRON HCL 4 MG/2ML IJ SOLN
INTRAMUSCULAR | Status: DC | PRN
  Administered 2022-10-22: 4 mg via INTRAVENOUS

## 2022-10-22 MED ORDER — MIDAZOLAM HCL 2 MG/2ML IJ SOLN
INTRAMUSCULAR | Status: DC | PRN
  Administered 2022-10-22: 2 mg via INTRAVENOUS

## 2022-10-22 MED ORDER — LACTATED RINGERS IV SOLN: INTRAVENOUS | Status: DC

## 2022-10-22 MED ORDER — DROPERIDOL 2.5 MG/ML IJ SOLN
INTRAMUSCULAR | Status: AC
  Filled 2022-10-22: qty 2

## 2022-10-22 MED ORDER — ACETAMINOPHEN 10 MG/ML IV SOLN
INTRAVENOUS | Status: AC
  Filled 2022-10-22: qty 100

## 2022-10-22 MED ORDER — DEXMEDETOMIDINE HCL IN NACL 80 MCG/20ML IV SOLN
INTRAVENOUS | Status: DC | PRN
  Administered 2022-10-22 (×3): 4 ug via INTRAVENOUS

## 2022-10-22 MED ORDER — OXYCODONE HCL 5 MG/5ML PO SOLN: 5.0000 mg | Freq: Once | ORAL | Status: AC | PRN

## 2022-10-22 MED ORDER — ONDANSETRON HCL 4 MG/2ML IJ SOLN
INTRAMUSCULAR | Status: AC
  Filled 2022-10-22: qty 2

## 2022-10-22 MED ORDER — OXYCODONE HCL 5 MG PO TABS
5.0000 mg | ORAL_TABLET | Freq: Once | ORAL | Status: AC | PRN
  Administered 2022-10-22: 5 mg via ORAL

## 2022-10-22 MED ORDER — CHLORHEXIDINE GLUCONATE 0.12 % MT SOLN
OROMUCOSAL | Status: AC
  Filled 2022-10-22: qty 15

## 2022-10-22 MED ORDER — CEFAZOLIN SODIUM-DEXTROSE 2-4 GM/100ML-% IV SOLN
INTRAVENOUS | Status: AC
  Filled 2022-10-22: qty 100

## 2022-10-22 MED ORDER — ONDANSETRON HCL 4 MG/2ML IJ SOLN
4.0000 mg | Freq: Once | INTRAMUSCULAR | Status: AC | PRN
  Administered 2022-10-22: 4 mg via INTRAVENOUS

## 2022-10-22 MED ORDER — ACETAMINOPHEN 10 MG/ML IV SOLN: 1000.0000 mg | Freq: Once | INTRAVENOUS | Status: DC | PRN

## 2022-10-22 MED ORDER — ACETAMINOPHEN 10 MG/ML IV SOLN
INTRAVENOUS | Status: DC | PRN
  Administered 2022-10-22: 1000 mg via INTRAVENOUS

## 2022-10-22 MED ORDER — PROPOFOL 10 MG/ML IV BOLUS
INTRAVENOUS | Status: DC | PRN
  Administered 2022-10-22: 180 mg via INTRAVENOUS

## 2022-10-22 MED ORDER — PHENYLEPHRINE HCL (PRESSORS) 10 MG/ML IV SOLN
INTRAVENOUS | Status: DC | PRN
  Administered 2022-10-22: 80 ug via INTRAVENOUS

## 2022-10-22 MED ORDER — VASOPRESSIN 20 UNIT/ML IV SOLN
INTRAVENOUS | Status: DC | PRN
  Administered 2022-10-22 (×3): 1 [IU] via INTRAVENOUS

## 2022-10-22 MED ORDER — ROCURONIUM BROMIDE 100 MG/10ML IV SOLN
INTRAVENOUS | Status: DC | PRN
  Administered 2022-10-22: 30 mg via INTRAVENOUS
  Administered 2022-10-22: 20 mg via INTRAVENOUS
  Administered 2022-10-22: 50 mg via INTRAVENOUS

## 2022-10-22 MED ORDER — HYDROCODONE-ACETAMINOPHEN 5-325 MG PO TABS: 1.0000 | ORAL_TABLET | Freq: Four times a day (QID) | ORAL | 0 refills | Status: AC | PRN

## 2022-10-22 SURGICAL SUPPLY — 59 items
ADAPTER IRRIG TUBE 2 SPIKE SOL (ADAPTER) ×4 IMPLANT
ADH LQ OCL WTPRF AMP STRL LF (MISCELLANEOUS)
ADHESIVE MASTISOL STRL (MISCELLANEOUS) IMPLANT
ADPR TBG 2 SPK PMP STRL ASCP (ADAPTER) ×4
BAG DRAIN SIEMENS DORNER NS (MISCELLANEOUS) ×2 IMPLANT
BAG DRN LRG CPC RND TRDRP CNTR (MISCELLANEOUS) ×2
BAG DRN NS LF (MISCELLANEOUS) ×2
BAG DRN RND TRDRP ANRFLXCHMBR (UROLOGICAL SUPPLIES) ×2
BAG PRESSURE INF REUSE 3000 (BAG) ×2 IMPLANT
BAG URINE DRAIN 2000ML AR STRL (UROLOGICAL SUPPLIES) ×2 IMPLANT
BAG URO DRAIN 4000ML (MISCELLANEOUS) ×2 IMPLANT
BASKET ZERO TIP 1.9FR (BASKET) IMPLANT
BRUSH SCRUB EZ 1% IODOPHOR (MISCELLANEOUS) ×2 IMPLANT
BSKT STON RTRVL ZERO TP 1.9FR (BASKET) ×2
CATH FOLEY 3WAY 30CC 24FR (CATHETERS) ×2
CATH URET FLEX-TIP 2 LUMEN 10F (CATHETERS) IMPLANT
CATH URETL OPEN 5X70 (CATHETERS) IMPLANT
CATH URETL OPEN END 4X70 (CATHETERS) ×2 IMPLANT
CATH URTH STD 24FR FL 3W 2 (CATHETERS) ×2 IMPLANT
CNTNR URN SCR LID CUP LEK RST (MISCELLANEOUS) IMPLANT
CONT SPEC 4OZ STRL OR WHT (MISCELLANEOUS)
CONTAINER COLLECT MORCELLATR (MISCELLANEOUS) ×2 IMPLANT
DRAPE UTILITY 15X26 TOWEL STRL (DRAPES) ×2 IMPLANT
DRSG TEGADERM 2-3/8X2-3/4 SM (GAUZE/BANDAGES/DRESSINGS) IMPLANT
ELECT BIVAP BIPO 22/24 DONUT (ELECTROSURGICAL)
ELECTRD BIVAP BIPO 22/24 DONUT (ELECTROSURGICAL) IMPLANT
FIBER LASER MOSES 200 DFL (Laser) ×2 IMPLANT
FIBER LASER MOSES 550 DFL (Laser) ×2 IMPLANT
FILTER OVERFLOW MORCELLATOR (FILTER) ×2 IMPLANT
GLOVE BIOGEL PI IND STRL 7.5 (GLOVE) ×2 IMPLANT
GOWN STRL REUS W/ TWL LRG LVL3 (GOWN DISPOSABLE) ×2 IMPLANT
GOWN STRL REUS W/ TWL XL LVL3 (GOWN DISPOSABLE) ×2 IMPLANT
GOWN STRL REUS W/TWL LRG LVL3 (GOWN DISPOSABLE) ×2
GOWN STRL REUS W/TWL XL LVL3 (GOWN DISPOSABLE) ×2
GUIDEWIRE STR DUAL SENSOR (WIRE) ×2 IMPLANT
HOLDER FOLEY CATH W/STRAP (MISCELLANEOUS) ×2 IMPLANT
IV NS IRRIG 3000ML ARTHROMATIC (IV SOLUTION) ×10 IMPLANT
KIT PROBE TRILOGY 3.9X350 (MISCELLANEOUS) IMPLANT
KIT TURNOVER CYSTO (KITS) ×2 IMPLANT
MBRN O SEALING YLW 17 FOR INST (MISCELLANEOUS) ×2
MEMBRANE SLNG YLW 17 FOR INST (MISCELLANEOUS) ×2 IMPLANT
MORCELLATOR COLLECT CONTAINER (MISCELLANEOUS) ×2
MORCELLATOR OVERFLOW FILTER (FILTER) ×2
MORCELLATOR ROTATION 4.75 335 (MISCELLANEOUS) ×2 IMPLANT
PACK CYSTO AR (MISCELLANEOUS) ×2 IMPLANT
SET CYSTO W/LG BORE CLAMP LF (SET/KITS/TRAYS/PACK) ×2 IMPLANT
SET IRRIG Y TYPE TUR BLADDER L (SET/KITS/TRAYS/PACK) ×2 IMPLANT
SHEATH NAVIGATOR HD 12/14X36 (SHEATH) IMPLANT
SLEEVE PROTECTION STRL DISP (MISCELLANEOUS) ×4 IMPLANT
STENT URET 6FRX24 CONTOUR (STENTS) IMPLANT
STENT URET 6FRX26 CONTOUR (STENTS) IMPLANT
SURGILUBE 2OZ TUBE FLIPTOP (MISCELLANEOUS) ×2 IMPLANT
SYR 10ML LL (SYRINGE) ×2 IMPLANT
SYR TOOMEY IRRIG 70ML (MISCELLANEOUS) ×2
SYRINGE TOOMEY IRRIG 70ML (MISCELLANEOUS) ×2 IMPLANT
TUBE PUMP MORCELLATOR PIRANHA (TUBING) ×2 IMPLANT
VALVE UROSEAL ADJ ENDO (VALVE) IMPLANT
WATER STERILE IRR 1000ML POUR (IV SOLUTION) ×2 IMPLANT
WATER STERILE IRR 500ML POUR (IV SOLUTION) ×2 IMPLANT

## 2022-10-22 NOTE — Addendum Note (Signed)
Addended by: Letta Kocher A on: 10/22/2022 08:44 AM   Modules accepted: Orders

## 2022-10-22 NOTE — Discharge Instructions (Addendum)
AMBULATORY SURGERY  ?DISCHARGE INSTRUCTIONS ? ? ?The drugs that you were given will stay in your system until tomorrow so for the next 24 hours you should not: ? ?Drive an automobile ?Make any legal decisions ?Drink any alcoholic beverage ? ? ?You may resume regular meals tomorrow.  Today it is better to start with liquids and gradually work up to solid foods. ? ?You may eat anything you prefer, but it is better to start with liquids, then soup and crackers, and gradually work up to solid foods. ? ? ?Please notify your doctor immediately if you have any unusual bleeding, trouble breathing, redness and pain at the surgery site, drainage, fever, or pain not relieved by medication. ? ? ? ?Additional Instructions: ? ? ? ?Please contact your physician with any problems or Same Day Surgery at [phone], Monday through Friday 6 am to 4 pm, or [place] [facility] [place] Main number at [phone].  ?

## 2022-10-22 NOTE — Op Note (Signed)
Date of procedure: 10/22/22  Preoperative diagnosis:  Left distal ureteral stone Bladder stones (~3cm) BPH with obstruction  Postoperative diagnosis:  Same  Procedure: Cystolitholapaxy (>2.5cm) Left ureteroscopy, basket extraction of stone, left retrograde pyelogram with intraoperative interpretation, left ureteral stent removal HoLEP (Holmium Laser Enucleation of the Prostate)  Surgeon: Legrand Rams, MD  Anesthesia: General  Complications: None  Intraoperative findings:  Massive prostate with high bladder neck and obstructing lateral lobes, ureteral orifices orthotopic bilaterally, close to the bladder neck Left distal ureteral stone basket extracted, kidney drained well with retrograde and no stent replaced Uncomplicated cystolitholapaxy of numerous bladder stones, largest 3 cm Uncomplicated HOLEP, excellent hemostasis, right ureteral orifice very close to the bladder neck but appeared intact  EBL: Minimal  Specimens: None  Enucleation time: 48 minutes  Morcellation time: 15 minutes  Intra-op weight: 128g  Drains: 24 French three-way, 60 cc in balloon  Indication: JOMARION MCBURNIE is a 67 y.o. patient with left distal ureteral stone previously stented in [facility] for flank pain, BPH with Foley dependent urinary retention, and multiple bladder stones.  After reviewing the management options for treatment, they elected to proceed with the above surgical procedure(s). We have discussed the potential benefits and risks of the procedure, side effects of the proposed treatment, the likelihood of the patient achieving the goals of the procedure, and any potential problems that might occur during the procedure or recuperation.  We specifically discussed the risks of bleeding, infection, hematuria and clot retention, need for additional procedures, possible overnight hospital stay, temporary urgency and incontinence, rare long-term incontinence, and retrograde ejaculation.  Informed  consent has been obtained.   Description of procedure:  The patient was taken to the operating room and general anesthesia was induced.  The patient was placed in the dorsal lithotomy position, prepped and draped in the usual sterile fashion, and preoperative antibiotics were administered.  SCDs were placed for DVT prophylaxis.  A preoperative time-out was performed.   Sissy Hoff sounds were used to gently dilated the urethra up to 11F. The 57 French continuous flow resectoscope was inserted into the urethra using the visual obturator  The prostate was massive with obstructing lateral lobes and a high bladder neck. The bladder was thoroughly inspected and notable for numerous bladder stones, largest measuring 3 cm, moderate trabeculations.  The ureteral orifices were located in orthotopic position.    The left ureteral stent was grasped and pulled to the meatus, and a sensor wire was advanced through the stent up to the kidney under fluoroscopic vision, and the old stent removed.  A semirigid long ureteroscope was advanced alongside the wire into the left ureter and in the mid ureter identified a yellow stone.  This was basket extracted and deposited into the bladder.  Thorough ureteroscopy all the way up to the proximal ureter showed no other ureteral stones.  A retrograde pyelogram was performed from the proximal ureter and showed no extravasation or filling defects.  Fluoroscopy a few minutes later showed excellent drainage of contrast, no stent was replaced.  The laser was set to 2 J and 20 Hz and used to methodically dust all stones in the bladder and pieces irrigated free.  The largest stone was a jack stone measuring 3 cm in size.  All stone fragments were removed from the bladder.  The laser was set to 2 J and 60 Hz and early apical release was performed by making a circumferential mucosal incision proximal to the sphincter.  A lambda incision was  then made proximal to the verumontanum.  The  prostate was enucleated en bloc circumferentially into the bladder.  The capsule was examined and laser was used for meticulous hemostasis.  The left ureteral orifice was clearly intact, the right ureteral orifice was very close to the bladder neck but appeared intact.  The 49 French resectoscope was then switched out for the 26 French nephroscope and prostate tissue was morcellated(Piranha) and the tissue sent to pathology.  A 24 French three-way catheter was inserted easily with the aid of a catheter guide, and 60 cc were placed in the balloon.  Urine was faint pink.  The catheter irrigated easily with a Toomey syringe.  CBI was initiated.   The patient tolerated the procedure well without any immediate complications and was extubated and transferred to the recovery room in stable condition.  Urine was clear on fast CBI.  Disposition: Stable to PACU  Plan: Wean CBI in PACU, anticipate discharge home today with Foley removal in clinic in 2-3 days  Legrand Rams, MD 10/22/2022

## 2022-10-22 NOTE — Transfer of Care (Signed)
Immediate Anesthesia Transfer of Care Note  Patient: [name]  Procedure(s) Performed: HOLEP-LASER ENUCLEATION OF THE PROSTATE WITH MORCELLATION CYSTOSCOPY WITH LITHOLAPAXY URETEROSCOPY WITH STENT REMOVAL (Left)  Patient Location: PACU  Anesthesia Type:General  Level of Consciousness: awake, alert , and oriented  Airway & Oxygen Therapy: Patient connected to nasal cannula oxygen  Post-op Assessment: Report given to RN and Post -op Vital signs reviewed and stable  Post vital signs: Reviewed and stable  Last Vitals:  Vitals Value Taken Time  BP 115/65   Temp 97.0 F   Pulse 58 10/22/22 1125  Resp 16 10/22/22 1125  SpO2 97 % 10/22/22 1125  Vitals shown include unvalidated device data.  Last Pain:  Vitals:   10/22/22 0839  TempSrc: Temporal  PainSc: 0-No pain      Patients Stated Pain Goal: 0 (10/22/22 0839)  Complications: No notable events documented.

## 2022-10-22 NOTE — Anesthesia Preprocedure Evaluation (Signed)
Anesthesia Evaluation  Patient identified by MRN, date of birth, ID band Patient awake  General Assessment Comment:  Hx urinary retention post op  Reviewed: Allergy & Precautions, NPO status , Patient's Chart, lab work & pertinent test results  History of Anesthesia Complications (+) history of anesthetic complications  Airway Mallampati: II  TM Distance: >3 FB Neck ROM: Full    Dental no notable dental hx. (+) Teeth Intact   Pulmonary sleep apnea , neg COPD, Patient abstained from smoking.Not current smoker   Pulmonary exam normal breath sounds clear to auscultation       Cardiovascular Exercise Tolerance: Good METS(-) hypertension(-) CAD and (-) Past MI + dysrhythmias Atrial Fibrillation  Rhythm:Regular Rate:Normal - Systolic murmurs Per cardiology, "patient denied any angina or DOE. EKG today showed NSR. Patient is at a low risk for any perioperative cardiac complications for a low to intermediate risk procedure, HoLEP based on his comorbidities (which are none except for paroxysmal A-fib). I am obtaining 2D echocardiogram to update his LVEF, but this can be performed after surgery. I will also start him on systemic anticoagulation after his surgeries".     Neuro/Psych negative neurological ROS  negative psych ROS   GI/Hepatic ,neg GERD  ,,(+)     (-) substance abuse    Endo/Other  neg diabetes    Renal/GU negative Renal ROS     Musculoskeletal   Abdominal   Peds  Hematology   Anesthesia Other Findings Past Medical History: No date: A-fib [facility])     Comment:  a.) CHA2DS2VASc = 2 (age, vascular disease history);                b.) rate/rhythm maintained on oral metoprolol tartrate;               no chronic anticoagulation No date: Adenomatous polyps No date: Aortic atherosclerosis (HCC) No date: Bladder stones No date: BPH with urinary obstruction No date: Complication of anesthesia     Comment:  a.) acute  postoperative urinary retention in 07/2022               following foot surgery at [facility] --> required visit to ED               and placement of foley catheter x 2 months No date: Erectile dysfunction     Comment:  a.) on PDE5i (sildenafil) No date: Family history of colon cancer No date: Foot lesion No date: Hepatic steatosis No date: History of kidney stones No date: Liver hemangioma 10/01/2022: OSA (obstructive sleep apnea)     Comment:  a.) s/p PSG 10/01/2022 --> moderate sleep apnea (AHI               15.7/hr); awaiting CPAP machine No date: S/P dilatation of esophageal stricture 08/2022: Syncope and collapse     Comment:  a.) s/p initiation of flomax + finasteride  --> fell and              hit head sustaining laceration that ultimately required               skin closure with staples  Reproductive/Obstetrics                             Anesthesia Physical Anesthesia Plan  ASA: 2  Anesthesia Plan: General   Post-op Pain Management: Ofirmev IV (intra-op)*   Induction: Intravenous  PONV Risk Score and Plan: 3 and Ondansetron,  Dexamethasone and Midazolam  Airway Management Planned: Oral ETT  Additional Equipment: None  Intra-op Plan:   Post-operative Plan: Extubation in OR  Informed Consent: I have reviewed the patients History and Physical, chart, labs and discussed the procedure including the risks, benefits and alternatives for the proposed anesthesia with the patient or authorized representative who has indicated his/her understanding and acceptance.     Dental advisory given  Plan Discussed with: CRNA and Surgeon  Anesthesia Plan Comments: (Discussed risks of anesthesia with patient, including PONV, sore throat, lip/dental/eye damage. Rare risks discussed as well, such as cardiorespiratory and neurological sequelae, and allergic reactions. Discussed the role of CRNA in patient's perioperative care. Patient understands.)        Anesthesia Quick Evaluation

## 2022-10-22 NOTE — Interval H&P Note (Signed)
UROLOGY H&P UPDATE  Agree with prior H&P dated 09/28/2022.  67 year old male with BPH, 160 g prostate, Foley dependent urinary retention.  Also was seen in [place] on 10/16/2022 with left-sided flank pain and found to have a persistent 5 mm left distal ureteral stone, underwent left ureteral stent placement with Dr. Lafonda Mosses  Cardiac: RRR Lungs: CTA bilaterally  Laterality: Left Procedure: Left ureteroscopy, laser lithotripsy, possible stent change, HOLEP  Urine: Culture 5/24 mixed species  We specifically discussed the risks ureteroscopy including bleeding, infection/sepsis, stent related symptoms including flank pain/urgency/frequency/incontinence/dysuria, ureteral injury, inability to access stone, or need for staged or additional procedures.  We discussed the risks and benefits of HoLEP at length.  The procedure requires general anesthesia and takes 1 to 2 hours, and a holmium laser is used to enucleate the prostate and push this tissue into the bladder.  A morcellator is then used to remove this tissue, which is sent for pathology.  The vast majority(>95%) of patients are able to discharge the same day with a catheter in place for 2 to 3 days, and will follow-up in clinic for a voiding trial.  We specifically discussed the risks of bleeding, infection, retrograde ejaculation, temporary urgency and urge incontinence, very low risk of long-term incontinence, urethral stricture/bladder neck contracture, pathologic evaluation of prostate tissue and possible detection of prostate cancer or other malignancy, and possible need for additional procedures.    Sondra Come, MD 10/22/2022

## 2022-10-22 NOTE — Anesthesia Postprocedure Evaluation (Signed)
Anesthesia Post Note  Patient: [name]  Procedure(s) Performed: HOLEP-LASER ENUCLEATION OF THE PROSTATE WITH MORCELLATION CYSTOSCOPY WITH LITHOLAPAXY URETEROSCOPY WITH STENT REMOVAL (Left)  Patient location during evaluation: PACU Anesthesia Type: General Level of consciousness: awake and alert Pain management: pain level controlled Vital Signs Assessment: post-procedure vital signs reviewed and stable Respiratory status: spontaneous breathing, nonlabored ventilation, respiratory function stable and patient connected to nasal cannula oxygen Cardiovascular status: blood pressure returned to baseline and stable Postop Assessment: no apparent nausea or vomiting Anesthetic complications: no   No notable events documented.   Last Vitals:  Vitals:   10/22/22 1230 10/22/22 1240  BP: 128/88 139/87  Pulse: 79 80  Resp: 15 17  Temp:  (!) 36.4 C  SpO2: 97% 99%    Last Pain:  Vitals:   10/22/22 1240  TempSrc:   PainSc: 4                  Corinda Gubler

## 2022-10-22 NOTE — Anesthesia Procedure Notes (Signed)
Procedure Name: Intubation Date/Time: 10/22/2022 9:33 AM  Performed by: Maryla Morrow., CRNAPre-anesthesia Checklist: Patient identified, Patient being monitored, Timeout performed, Emergency Drugs available and Suction available Patient Re-evaluated:Patient Re-evaluated prior to induction Oxygen Delivery Method: Circle system utilized Preoxygenation: Pre-oxygenation with 100% oxygen Induction Type: IV induction Ventilation: Mask ventilation without difficulty Laryngoscope Size: McGraph and 4 Grade View: Grade I Tube type: Oral Tube size: 7.5 mm Number of attempts: 1 Airway Equipment and Method: Stylet Placement Confirmation: ETT inserted through vocal cords under direct vision, positive ETCO2 and breath sounds checked- equal and bilateral Secured at: 22 cm Tube secured with: Tape Dental Injury: Teeth and Oropharynx as per pre-operative assessment

## 2022-10-23 DIAGNOSIS — R55 Syncope and collapse: Secondary | ICD-10-CM

## 2022-10-25 ENCOUNTER — Ambulatory Visit: Payer: Medicare Other | Admitting: Physician Assistant

## 2022-10-25 ENCOUNTER — Other Ambulatory Visit (HOSPITAL_COMMUNITY): Payer: Medicare Other

## 2022-10-25 ENCOUNTER — Telehealth: Payer: Self-pay

## 2022-10-25 ENCOUNTER — Encounter: Payer: Self-pay | Admitting: Physician Assistant

## 2022-10-25 VITALS — BP 157/5 | HR 75 | Ht 71.0 in | Wt 218.0 lb

## 2022-10-25 DIAGNOSIS — N401 Enlarged prostate with lower urinary tract symptoms: Secondary | ICD-10-CM

## 2022-10-25 DIAGNOSIS — N138 Other obstructive and reflux uropathy: Secondary | ICD-10-CM

## 2022-10-25 MED ORDER — METOPROLOL TARTRATE 25 MG PO TABS: 25.0000 mg | ORAL_TABLET | Freq: Two times a day (BID) | ORAL | 3 refills | Status: DC

## 2022-10-25 MED ORDER — APIXABAN 5 MG PO TABS: 5.0000 mg | ORAL_TABLET | Freq: Two times a day (BID) | ORAL | 11 refills | Status: DC

## 2022-10-25 NOTE — Telephone Encounter (Signed)
Patient notified. Pt to see surgeon at [facility] on 6/4 for a second surgery in July. Pt will follow up with surgeon as to when he should hold Eliquis prior to next surgery. Pt aware to call office for patient assistance forms if Eliquis is too costly.  Patient had no questions or concerns at this time.  PCP copied.

## 2022-10-25 NOTE — Telephone Encounter (Signed)
-----   Message from Marjo Bicker, MD sent at 10/24/2022  7:27 PM EDT ----- There is 6% Afib burden with the longest episode lasting 20 hours 15 minutes. Symptoms also correlated with Afib. Increase Metoprolol tartarate from 12.5 to 25 mg BID. Start Eliquis 5 mg BID (needs patient assistance program if co-pay is higher). Eliquis needs to be started only if safe from bleeding standpoint (he just had surgery on 10/22/22, check with urology).

## 2022-10-25 NOTE — Patient Instructions (Signed)

## 2022-10-25 NOTE — Progress Notes (Signed)
Catheter Removal  Patient is present today for a catheter removal.  60ml of water was drained from the balloon. A 24FR three-way foley cath was removed from the bladder, no complications were noted. Patient tolerated well.  Performed by: Carman Ching, PA-C   Additional notes: Counseled patient on normal postoperative findings including dysuria, gross hematuria, and urinary urgency/leakage. Counseled patient to begin Kegel exercises 3x10 sets daily to increase urinary control and wear absorbent products as needed for security. Written and verbal resources provided today. Surgical pathology pending; will defer to Dr. Richardo Hanks to share results when available.   Follow up/ Additional notes: Return in about 12 weeks (around 01/17/2023) for Postop f/u with Dr. Richardo Hanks.

## 2022-10-27 ENCOUNTER — Telehealth: Payer: Self-pay

## 2022-10-27 NOTE — Telephone Encounter (Signed)
Notified patient of sleep study results and recommendations. Patient state,"I just had surgery last Friday and I have another surgery coming up. I will wait until my next appointment (12/29/22) to discuss other options with Dr. Mikal Plane." All questions (if any) were answered. Patient verbalized understanding.

## 2022-10-27 NOTE — Telephone Encounter (Signed)
-----   Message from Quintella Reichert, MD sent at 10/04/2022  9:15 PM EDT ----- Please let patient know that they have sleep apnea and recommend treating with CPAP.  Please order an auto CPAP from 4-15cm H2O with heated humidity and mask of choice.  Order overnight pulse ox on CPAP.  Followup with me in 6 weeks.

## 2022-10-29 LAB — SURGICAL PATHOLOGY

## 2022-11-16 DIAGNOSIS — D1803 Hemangioma of intra-abdominal structures: Secondary | ICD-10-CM | POA: Diagnosis not present

## 2022-11-22 ENCOUNTER — Ambulatory Visit (HOSPITAL_COMMUNITY)
Admission: RE | Admit: 2022-11-22 | Discharge: 2022-11-22 | Disposition: A | Discharge status: Home / Self Care | Payer: Medicare Other | Source: Ambulatory Visit | Attending: Internal Medicine | Admitting: Internal Medicine

## 2022-11-22 DIAGNOSIS — I4891 Unspecified atrial fibrillation: Secondary | ICD-10-CM | POA: Diagnosis not present

## 2022-11-22 LAB — ECHOCARDIOGRAM COMPLETE
AR max vel: 2.03 cm2
AV Area VTI: 2.18 cm2
AV Area mean vel: 2.17 cm2
AV Mean grad: 3.3 mmHg
AV Peak grad: 7.5 mmHg
Ao pk vel: 1.37 m/s
Area-P 1/2: 4.15 cm2
S' Lateral: 3.2 cm

## 2022-11-22 NOTE — Progress Notes (Signed)
*  PRELIMINARY RESULTS* Echocardiogram 2D Echocardiogram has been performed.  Stacey Drain 11/22/2022, 11:17 AM

## 2022-12-29 ENCOUNTER — Encounter: Payer: Self-pay | Admitting: Internal Medicine

## 2022-12-29 ENCOUNTER — Ambulatory Visit: Payer: Medicare Other | Attending: Internal Medicine | Admitting: Internal Medicine

## 2022-12-29 VITALS — BP 106/66 | HR 56 | Ht 74.0 in | Wt 225.4 lb

## 2022-12-29 DIAGNOSIS — I4891 Unspecified atrial fibrillation: Secondary | ICD-10-CM

## 2022-12-29 NOTE — Patient Instructions (Addendum)

## 2022-12-29 NOTE — Progress Notes (Signed)
Cardiology Office Note  Date: 12/29/2022   ID: [name], DOB 11/18/1955, MRN [id number]  PCP:  Aliene Beams, MD  Cardiologist:  Marjo Bicker, MD Electrophysiologist:  None    History of Present Illness: [name] is a 67 y.o. male with paroxysmal A-fib is here for follow-up visit.  Patient underwent cystolitholapaxy, left ureteral stone extraction and stent removal, HoLEP (holmium laser enucleation of the prostate) on 10/22/2022 for the preop diagnosis of left distal ureteral stone, bladder stones and BPH with obstruction.  He was diagnosed with new onset A-fib recently.  Event monitor from 5/24 showed 6% A-fib burden with the longest episode lasting 20 hours 15 minutes and the symptoms correlated with A-fib.  Metoprolol tartrate dose was increased from 12.5 mg to 25 mg twice daily and Eliquis 5 mg twice daily was started.  He underwent sleep study that showed moderate OSA, awaiting CPAP mask fitting.  He is here for follow-up visit.  Overall great, no symptoms.  Denies having palpitations, SOB, fatigue, dizziness/syncope.  Past Medical History:  Diagnosis Date   A-fib [facility])    a.) CHA2DS2VASc = 2 (age, vascular disease history);  b.) rate/rhythm maintained on oral metoprolol tartrate; no chronic anticoagulation   Adenomatous polyps    Aortic atherosclerosis (HCC)    Bladder stones    BPH with urinary obstruction    Complication of anesthesia    a.) acute postoperative urinary retention in 07/2022 following foot surgery at [facility] --> required visit to ED and placement of foley catheter x 2 months   Erectile dysfunction    a.) on PDE5i (sildenafil)   Family history of colon cancer    Foot lesion    Hepatic steatosis    History of kidney stones    Liver hemangioma    OSA (obstructive sleep apnea) 10/01/2022   a.) s/p PSG 10/01/2022 --> moderate sleep apnea (AHI 15.7/hr); awaiting CPAP machine   S/P dilatation of esophageal stricture    Syncope and collapse 08/2022    a.) s/p initiation of flomax + finasteride  --> fell and hit head sustaining laceration that ultimately required skin closure with staples    Past Surgical History:  Procedure Laterality Date   BACK SURGERY  2003   lumbar-ruptured disc   COLONOSCOPY  06/23/2011   Procedure: COLONOSCOPY;  Surgeon: Malissa Hippo, MD;  Location: [facility];  Service: Endoscopy;  Laterality: N/A;  3:00   COLONOSCOPY N/A 07/15/2016   Procedure: COLONOSCOPY;  Surgeon: Malissa Hippo, MD;  Location: [facility];  Service: Endoscopy;  Laterality: N/A;  830   COLONOSCOPY WITH PROPOFOL N/A 08/27/2021   Procedure: COLONOSCOPY WITH PROPOFOL;  Surgeon: Malissa Hippo, MD;  Location: [facility];  Service: Endoscopy;  Laterality: N/A;  730   CYSTOSCOPY W/ URETERAL STENT PLACEMENT Left 10/16/2022   Procedure: CYSTOSCOPY WITH STENT PLACEMENT;  Surgeon: Despina Arias, MD;  Location: [facility] OR[facility];  Service: Urology;  Laterality: Left;   CYSTOSCOPY WITH LITHOLAPAXY N/A 10/22/2022   Procedure: CYSTOSCOPY WITH LITHOLAPAXY;  Surgeon: Sondra Come, MD;  Location: [facility] ORS;  Service: Urology;  Laterality: N/A;   ESOPHAGOGASTRODUODENOSCOPY     HOLEP-LASER ENUCLEATION OF THE PROSTATE WITH MORCELLATION N/A 10/22/2022   Procedure: HOLEP-LASER ENUCLEATION OF THE PROSTATE WITH MORCELLATION;  Surgeon: Sondra Come, MD;  Location: [facility] ORS;  Service: Urology;  Laterality: N/A;   left lytic foot lesion, left cuboid Left 07/2022   [facility]   URETEROSCOPY Left 10/22/2022  Procedure: URETEROSCOPY WITH STENT REMOVAL;  Surgeon: Sondra Come, MD;  Location: [facility] ORS;  Service: Urology;  Laterality: Left;    Current Outpatient Medications  Medication Sig Dispense Refill   apixaban (ELIQUIS) 5 MG TABS tablet Take 1 tablet (5 mg total) by mouth 2 (two) times daily. 60 tablet 11   metoprolol tartrate (LOPRESSOR) 25 MG tablet Take 1 tablet (25 mg total) by mouth 2 (two) times daily. 90 tablet 3   ondansetron (ZOFRAN-ODT) 8 MG  disintegrating tablet Take 1 tablet (8 mg total) by mouth every 8 (eight) hours as needed for nausea. 20 tablet 0   sildenafil (VIAGRA) 100 MG tablet 1/2 to 1 tab po prn (Patient taking differently: Take 50-100 mg by mouth as needed for erectile dysfunction. 1/2 to 1 tab po prn) 30 tablet 99   traMADol (ULTRAM) 50 MG tablet Take 1 tablet (50 mg total) by mouth every 6 (six) hours as needed for severe pain. 20 tablet 0   No current facility-administered medications for this visit.   Allergies:  Patient has no known allergies.   Social History: The patient  reports that he has never smoked. He has never been exposed to tobacco smoke. He has never used smokeless tobacco. He reports that he does not drink alcohol and does not use drugs.   Family History: The patient's family history includes Cancer in his father.   ROS:  Please see the history of present illness. Otherwise, complete review of systems is positive for none.  All other systems are reviewed and negative.   Physical Exam: VS:  There were no vitals taken for this visit., BMI There is no height or weight on file to calculate BMI.  Wt Readings from Last 3 Encounters:  10/25/22 218 lb (98.9 kg)  10/22/22 218 lb 11.1 oz (99.2 kg)  10/16/22 218 lb 11.1 oz (99.2 kg)    General: Patient appears comfortable at rest. HEENT: Conjunctiva and lids normal, oropharynx clear with moist mucosa. Neck: Supple, no elevated JVP or carotid bruits, no thyromegaly. Lungs: Clear to auscultation, nonlabored breathing at rest. Cardiac: Regular rate and rhythm, no S3 or significant systolic murmur, no pericardial rub. Abdomen: Soft, nontender, no hepatomegaly, bowel sounds present, no guarding or rebound. Extremities: No pitting edema, distal pulses 2+. Skin: Warm and dry. Musculoskeletal: No kyphosis. Neuropsychiatric: Alert and oriented x3, affect grossly appropriate.  Recent Labwork: 09/22/2022: TSH 3.583 10/16/2022: ALT 24; AST 23; BUN 19;  Creatinine, Ser 1.53; Hemoglobin 14.5; Magnesium 2.0; Platelets 228; Potassium 4.5; Sodium 138  No results found for: "CHOL", "TRIG", "HDL", "CHOLHDL", "VLDL", "LDLCALC", "LDLDIRECT"  Other Studies Reviewed Today:   Assessment and Plan: Patient is a 67 year old M with paroxysmal A-fib, moderate OSA is here for follow-up visit.  # Paroxysmal atrial fibrillation (EKG today showed sinus bradycardia, HR 50s) -Event monitor from 5/23 showed 6% A-fib burden with the longest episode lasting 20 hours 15 minutes. Symptoms correlated with atrial fibrillation. Continue metoprolol tartrate 25 mg twice daily.  CHA2DS2-VASc score is 1 however he has a strong family history of A-fib (brother had A-fib, mother had history of strokes) due to which I started him on systemic anticoagulation with Eliquis 5 mg twice daily. Sleep study showed moderate OSA and awaiting CPAP mask fitting. Echo showed normal LVEF and no valvular heart disease.  # Moderate OSA -Follow-up with Dr. Mayford Knife for CPAP initiation.   I have spent a total of 30 minutes with patient reviewing chart, EKGs, labs and examining patient as well  as establishing an assessment and plan that was discussed with the patient.  > 50% of time was spent in direct patient care.    Medication Adjustments/Labs and Tests Ordered: Current medicines are reviewed at length with the patient today.  Concerns regarding medicines are outlined above.   Tests Ordered: No orders of the defined types were placed in this encounter.   Medication Changes: No orders of the defined types were placed in this encounter.   Disposition:  Follow up  6 months  Signed,  Verne Spurr, MD, 12/29/2022 11:39 AM    [facility] [address] [place], [place] [address]

## 2023-01-06 DIAGNOSIS — M85579 Aneurysmal bone cyst, unspecified ankle and foot: Secondary | ICD-10-CM | POA: Diagnosis not present

## 2023-01-06 DIAGNOSIS — M19072 Primary osteoarthritis, left ankle and foot: Secondary | ICD-10-CM | POA: Diagnosis not present

## 2023-01-18 DIAGNOSIS — R310 Gross hematuria: Secondary | ICD-10-CM | POA: Diagnosis not present

## 2023-01-18 DIAGNOSIS — N50812 Left testicular pain: Secondary | ICD-10-CM | POA: Diagnosis not present

## 2023-01-18 DIAGNOSIS — J069 Acute upper respiratory infection, unspecified: Secondary | ICD-10-CM | POA: Diagnosis not present

## 2023-01-20 ENCOUNTER — Encounter: Payer: Self-pay | Admitting: Urology

## 2023-01-20 ENCOUNTER — Ambulatory Visit: Payer: Medicare Other | Admitting: Urology

## 2023-01-20 VITALS — BP 117/73 | HR 81 | Ht 74.0 in | Wt 225.0 lb

## 2023-01-20 DIAGNOSIS — R319 Hematuria, unspecified: Secondary | ICD-10-CM | POA: Diagnosis not present

## 2023-01-20 DIAGNOSIS — N50812 Left testicular pain: Secondary | ICD-10-CM

## 2023-01-20 DIAGNOSIS — N138 Other obstructive and reflux uropathy: Secondary | ICD-10-CM

## 2023-01-20 DIAGNOSIS — N401 Enlarged prostate with lower urinary tract symptoms: Secondary | ICD-10-CM

## 2023-01-20 DIAGNOSIS — N453 Epididymo-orchitis: Secondary | ICD-10-CM

## 2023-01-20 LAB — BLADDER SCAN AMB NON-IMAGING

## 2023-01-20 MED ORDER — DOXYCYCLINE HYCLATE 100 MG PO CAPS
100.0000 mg | ORAL_CAPSULE | Freq: Two times a day (BID) | ORAL | 0 refills | Status: DC
Start: 2023-01-20 — End: 2023-02-03

## 2023-01-20 NOTE — Patient Instructions (Signed)
Epididymitis  Epididymitis is inflammation or swelling of the epididymis. This is caused by an infection. The epididymis is a cord-like structure that is located along the top and back part of the testicle. It collects and stores sperm from the testicle. This condition can also cause pain and swelling of the testicle and scrotum. Symptoms usually start suddenly (acute epididymitis). Sometimes epididymitis starts gradually and lasts for a while (chronic epididymitis). Chronic epididymitis may be harder to treat. What are the causes? In men ages 1-40, this condition is usually caused by a bacterial infection or a sexually transmitted infection (STI), such as gonorrhea or chlamydia. In men 33 and older, this condition is usually caused by bacteria from a urinary blockage or from abnormalities in the urinary system. These can result from: Having a tube placed into the bladder (urinary catheter). Having an enlarged or inflamed prostate gland. Having recently had urinary tract surgery. Having a problem with a backward flow of urine (retrograde). In men who have a condition that weakens the body's defense system (immune system), such as human immunodeficiency virus (HIV), this condition can be caused by: Other bacteria, including tuberculosis and syphilis. Viruses. Fungi. Sometimes this condition occurs without infection. This may happen because of trauma or repetitive activities such as sports. What increases the risk? You are more likely to develop this condition if you have: Unprotected sex with more than one partner. Anal sex. Had recent surgery. A urinary catheter. Urinary problems. A suppressed immune system. What are the signs or symptoms? This condition usually begins suddenly with chills, fever, and pain behind the scrotum and in the testicle. Other symptoms include: Swelling of the scrotum, testicle, or both. Pain when ejaculating or urinating. Pain in the back or  abdomen. Nausea. Itching and discharge from the penis. A frequent need to pass urine. Redness, increased warmth, and tenderness of the scrotum. How is this diagnosed? Your health care provider can diagnose this condition based on your symptoms and medical history. Your health care provider will also do a physical exam to check your scrotum and testicle for swelling, pain, and redness. You may also have other tests, including: Testing of discharge from the penis. Testing your urine for infections, such as STIs. Ultrasound to check for blood flow and inflammation. Your health care provider may test you for other STIs, including HIV. How is this treated? Treatment for this condition depends on the cause. If your condition is caused by a bacterial infection, oral antibiotic medicine may be prescribed. If the bacterial infection has spread to your blood, you may need to receive IV antibiotics. For both bacterial and nonbacterial epididymitis, you may be treated with: Rest. Elevation of the scrotum. Pain medicines. Anti-inflammatory medicines. Surgery may be needed if: You have pus buildup in the scrotum (abscess). You have epididymitis that has not responded to other treatments. Follow these instructions at home: Medicines Take over-the-counter and prescription medicines only as told by your health care provider. If you were prescribed an antibiotic medicine, take it as told by your health care provider. Do not stop taking the antibiotic even if your condition improves. Sexual activity If your epididymitis was caused by an STI, avoid sexual activity until your treatment is complete. Inform your sexual partner or partners if you test positive for an STI. They may need to be treated. Do not engage in sexual activity with your partner or partners until their treatment is completed. Managing pain and swelling  If directed, raise (elevate) your scrotum and apply ice.  To do this: Put ice in a  plastic bag. Place a small towel or pillow between your legs. Rest your scrotum on the pillow or towel. Place another towel between your skin and the plastic bag. Leave the ice on for 20 minutes, 2-3 times a day. Remove the ice if your skin turns bright red. This is very important. If you cannot feel pain, heat, or cold, you have a greater risk of damage to the area. Keep your scrotum elevated and supported while resting. Ask your health care provider if you should wear a scrotal support, such as a jockstrap. Wear it as told by your health care provider. Try taking a sitz bath to help with discomfort. This is a warm water bath that is taken while you are sitting down. The water should come up to your hips and should cover your buttocks. Do this 3-4 times per day or as told by your health care provider. General instructions Drink enough fluid to keep your urine pale yellow. Return to your normal activities as told by your health care provider. Ask your health care provider what activities are safe for you. Keep all follow-up visits. This is important. Contact a health care provider if: You have a fever. Your pain medicine is not helping. Your pain is getting worse. Your symptoms do not improve within 3 days. Summary Epididymitis is inflammation or swelling of the epididymis. This is caused by an infection. This condition can also cause pain and swelling of the testicle and scrotum. Treatment for this condition depends on the cause. If your condition is caused by a bacterial infection, oral antibiotic medicine may be prescribed. Inform your sexual partner or partners if you test positive for an STI. They may need to be treated. Do not engage in sexual activity with your partner or partners until their treatment is completed. Contact a health care provider if your symptoms do not improve within 3 days. This information is not intended to replace advice given to you by your health care provider.  Make sure you discuss any questions you have with your health care provider. Document Revised: 12/17/2020 Document Reviewed: 12/17/2020 Elsevier Patient Education  2024 [employer].

## 2023-01-20 NOTE — Progress Notes (Signed)
   01/20/2023 11:16 AM   [name] 06-10-1955 [id number]  Reason for visit: Follow up left ureteral stone, BPH status post HOLEP, new urinary symptoms and testicle pain  HPI: 67 year old male who was referred for Foley dependent urinary retention and large bladder stone as well as a left distal ureteral stone and underwent uncomplicated cystoscopy and HOLEP on 10/22/2022 with simultaneous basket extraction of the left ureteral stone.  100 g of benign tissue removed.  He has been urinating well since that time, PVR today is normal at 10ml.  He is voiding with a strong stream, having nocturia 1-2 times at night, minimal leakage that continues to improve.  He does report 3 to 4 days of left testicular pain as well as some hematuria.  On exam he does have edematous left testicle that is very tender consistent with epididymoorchitis.  Based on prior culture data, I recommended a 10-day course of doxycycline twice daily.  He was unable to give a urine specimen today after already voiding for PVR.  Encouraged to continue Kegel exercises, avoid bladder irritants, return precautions discussed  Doxycycline 100 mg twice daily x 10 days for left epididymoorchitis RTC 6 months PVR and symptom check    Sondra Come, MD  [facility] Urology [address], [address] [place], [place] [address] [phone]

## 2023-01-25 ENCOUNTER — Ambulatory Visit: Payer: Medicare Other | Admitting: Urology

## 2023-02-02 ENCOUNTER — Telehealth: Payer: Self-pay | Admitting: *Deleted

## 2023-02-02 NOTE — Telephone Encounter (Signed)
Patient wife called in today and states patient is in a lot of pain with this testicle . She states he is trying to throw up. No fevers at this . Advised patient if he is in that much pain and feel that bad he needs to go to the Er. No fevers at this .  Talked with Sam . Sam advised also if he is in that much pain go to er. We did put him on the scheduled for tomorrow also.

## 2023-02-03 ENCOUNTER — Ambulatory Visit: Payer: Medicare Other | Admitting: Physician Assistant

## 2023-02-03 VITALS — BP 106/69 | HR 69 | Temp 97.6°F | Ht 74.0 in | Wt 225.0 lb

## 2023-02-03 DIAGNOSIS — R319 Hematuria, unspecified: Secondary | ICD-10-CM | POA: Diagnosis not present

## 2023-02-03 DIAGNOSIS — N453 Epididymo-orchitis: Secondary | ICD-10-CM | POA: Diagnosis not present

## 2023-02-03 DIAGNOSIS — N50812 Left testicular pain: Secondary | ICD-10-CM | POA: Diagnosis not present

## 2023-02-03 DIAGNOSIS — N138 Other obstructive and reflux uropathy: Secondary | ICD-10-CM | POA: Diagnosis not present

## 2023-02-03 DIAGNOSIS — N401 Enlarged prostate with lower urinary tract symptoms: Secondary | ICD-10-CM | POA: Diagnosis not present

## 2023-02-03 LAB — URINALYSIS, COMPLETE
Bilirubin, UA: NEGATIVE
Glucose, UA: NEGATIVE
Ketones, UA: NEGATIVE
Nitrite, UA: NEGATIVE
Protein,UA: NEGATIVE
Specific Gravity, UA: >1.03 — ABNORMAL HIGH (ref 1.005–1.030)
Urobilinogen, Ur: 0.2 mg/dL (ref 0.2–1.0)
pH, UA: 5 (ref 5.0–7.5)

## 2023-02-03 LAB — MICROSCOPIC EXAMINATION

## 2023-02-03 MED ORDER — DOXYCYCLINE HYCLATE 100 MG PO CAPS
100.0000 mg | ORAL_CAPSULE | Freq: Two times a day (BID) | ORAL | 0 refills | Status: AC
Start: 2023-02-03 — End: 2023-02-17

## 2023-02-03 MED ORDER — CEFTRIAXONE SODIUM 1 G IJ SOLR
1.0000 g | Freq: Once | INTRAMUSCULAR | Status: AC
Start: 2023-02-03 — End: 2023-02-03
  Administered 2023-02-03: 1 g via INTRAMUSCULAR

## 2023-02-03 NOTE — Progress Notes (Signed)
02/03/2023 3:04 PM   [name] 1955/10/18 [id number]  CC: Chief Complaint  Patient presents with   Follow-up   HPI: [name] is a 67 y.o. male with PMH BPH with urinary retention s/p HOLEP, cystolitholapaxy, and basket extraction of distal left ureteral stone with Dr. Richardo Hanks on 10/22/2022 who presents today for evaluation of testicular pain.   He saw Dr. Richardo Hanks 14 days ago with reports of left testicular pain and hematuria.  Dr. Richardo Hanks started him on empiric doxycycline x 10 days.  Today he reports he completed antibiotics 5 days ago and his pain improved, but did not resolve.  Yesterday, his pain and nausea returned and were rather severe.  He took 2 hydrocodone, which resolved the pain.  He notes that he originally had fever and night sweats 2 weeks ago when his symptoms first began, but these have not recurred.  He is having some chills.  In-office UA today positive for 3+ blood and trace leukocytes; urine microscopy with 11-30 WBCs/HPF and 11-30 RBCs/HPF.  PMH: Past Medical History:  Diagnosis Date   A-fib [facility])    a.) CHA2DS2VASc = 2 (age, vascular disease history);  b.) rate/rhythm maintained on oral metoprolol tartrate; no chronic anticoagulation   Adenomatous polyps    Aortic atherosclerosis (HCC)    Bladder stones    BPH with urinary obstruction    Complication of anesthesia    a.) acute postoperative urinary retention in 07/2022 following foot surgery at [facility] --> required visit to ED and placement of foley catheter x 2 months   Erectile dysfunction    a.) on PDE5i (sildenafil)   Family history of colon cancer    Foot lesion    Hepatic steatosis    History of kidney stones    Liver hemangioma    OSA (obstructive sleep apnea) 10/01/2022   a.) s/p PSG 10/01/2022 --> moderate sleep apnea (AHI 15.7/hr); awaiting CPAP machine   S/P dilatation of esophageal stricture    Syncope and collapse 08/2022   a.) s/p initiation of flomax + finasteride  --> fell and  hit head sustaining laceration that ultimately required skin closure with staples    Surgical History: Past Surgical History:  Procedure Laterality Date   BACK SURGERY  2003   lumbar-ruptured disc   COLONOSCOPY  06/23/2011   Procedure: COLONOSCOPY;  Surgeon: Malissa Hippo, MD;  [facility];  Service: Endoscopy;  Laterality: N/A;  3:00   COLONOSCOPY N/A 07/15/2016   Procedure: COLONOSCOPY;  Surgeon: Malissa Hippo, MD;  [facility];  Service: Endoscopy;  Laterality: N/A;  830   COLONOSCOPY WITH PROPOFOL N/A 08/27/2021   Procedure: COLONOSCOPY WITH PROPOFOL;  Surgeon: Malissa Hippo, MD;  [facility];  Service: Endoscopy;  Laterality: N/A;  730   CYSTOSCOPY W/ URETERAL STENT PLACEMENT Left 10/16/2022   Procedure: CYSTOSCOPY WITH STENT PLACEMENT;  Surgeon: Despina Arias, MD;  Location: [facility];  Service: Urology;  Laterality: Left;   CYSTOSCOPY WITH LITHOLAPAXY N/A 10/22/2022   Procedure: CYSTOSCOPY WITH LITHOLAPAXY;  Surgeon: Sondra Come, MD;  Location: [facility];  Service: Urology;  Laterality: N/A;   ESOPHAGOGASTRODUODENOSCOPY     HOLEP-LASER ENUCLEATION OF THE PROSTATE WITH MORCELLATION N/A 10/22/2022   Procedure: HOLEP-LASER ENUCLEATION OF THE PROSTATE WITH MORCELLATION;  Surgeon: Sondra Come, MD;  Location: [facility] OR[facility];  Service: Urology;  Laterality: N/A;   left lytic foot lesion, left cuboid Left 07/2022   [facility]   URETEROSCOPY Left  10/22/2022   Procedure: URETEROSCOPY WITH STENT REMOVAL;  Surgeon: Sondra Come, MD;  Location: [facility] OR[facility];  Service: Urology;  Laterality: Left;    Home Medications:  Allergies as of 02/03/2023   No Known Allergies      Medication List        Accurate as of February 03, 2023  3:04 PM. If you have any questions, ask your nurse or doctor.          STOP taking these medications    ondansetron 8 MG tablet Commonly known as: ZOFRAN       TAKE these medications    apixaban 5 MG Tabs  tablet Commonly known as: ELIQUIS Take 1 tablet (5 mg total) by mouth 2 (two) times daily.   doxycycline 100 MG capsule Commonly known as: VIBRAMYCIN Take 1 capsule (100 mg total) by mouth every 12 (twelve) hours.   metoprolol tartrate 25 MG tablet Commonly known as: LOPRESSOR Take 1 tablet (25 mg total) by mouth 2 (two) times daily.   sildenafil 100 MG tablet Commonly known as: VIAGRA 1/2 to 1 tab po prn What changed:  how much to take how to take this when to take this reasons to take this        Allergies:  No Known Allergies  Family History: Family History  Problem Relation Age of Onset   Cancer Father     Social History:   reports that he has never smoked. He has never been exposed to tobacco smoke. He has never used smokeless tobacco. He reports that he does not drink alcohol and does not use drugs.  Physical Exam: BP 106/69   Pulse 69   Temp 97.6 F (36.4 C)   Ht 6\' 2"  (1.88 m)   Wt 225 lb (102.1 kg)   BMI 28.89 kg/m   Constitutional:  Alert and oriented, no acute distress, nontoxic appearing HEENT: [place], [place] Cardiovascular: No clubbing, cyanosis, or edema Respiratory: Normal respiratory effort, no increased work of breathing GU: Bilateral descended testicles.  Left testicle and epididymis are enlarged and exquisitely tender.  No scrotal edema, fluctuance, or crepitus. Skin: No rashes, bruises or suspicious lesions Neurologic: Grossly intact, no focal deficits, moving all 4 extremities Psychiatric: Normal mood and affect  Laboratory Data: Results for orders placed or performed in visit on 02/03/23  Microscopic Examination   Urine  Result Value Ref Range   WBC, UA 11-30 (A) 0 - 5 /hpf   RBC, Urine 11-30 (A) 0 - 2 /hpf   Epithelial Cells (non renal) 0-10 0 - 10 /hpf   Bacteria, UA Few None seen/Few  Urinalysis, Complete  Result Value Ref Range   Specific Gravity, UA >1.030 (H) 1.005 - 1.030   pH, UA 5.0 5.0 - 7.5   Color, UA Yellow Yellow    Appearance Ur Clear Clear   Leukocytes,UA Trace (A) Negative   Protein,UA Negative Negative/Trace   Glucose, UA Negative Negative   Ketones, UA Negative Negative   RBC, UA 3+ (A) Negative   Bilirubin, UA Negative Negative   Urobilinogen, Ur 0.2 0.2 - 1.0 mg/dL   Nitrite, UA Negative Negative   Microscopic Examination See below:    Assessment & Plan:   1. Epididymoorchitis History and physical exam are extremely suspicious for persistent left epididymoorchitis.  He was able to give a urine specimen today for culture, though this may result with no growth given antibiotic use.  Will treat with IM Rocephin and a prolonged course of doxycycline, this  time 14 days.  Will avoid Bactrim or fluoroquinolones given resistance on recent culture.  We discussed return precautions including fever, uncontrolled pain, and nausea/vomiting.  He expressed understanding. - Urinalysis, Complete - CULTURE, URINE COMPREHENSIVE - cefTRIAXone (ROCEPHIN) injection 1 g - doxycycline (VIBRAMYCIN) 100 MG capsule; Take 1 capsule (100 mg total) by mouth every 12 (twelve) hours for 14 days.  Dispense: 28 capsule; Refill: 0   Return if symptoms worsen or fail to improve.  Carman Ching, PA-C  [facility] [place] [address] [place], [place] [address] [phone]

## 2023-02-06 LAB — CULTURE, URINE COMPREHENSIVE

## 2023-03-09 ENCOUNTER — Ambulatory Visit (INDEPENDENT_AMBULATORY_CARE_PROVIDER_SITE_OTHER): Payer: Medicare Other | Admitting: Otolaryngology

## 2023-03-09 ENCOUNTER — Encounter (INDEPENDENT_AMBULATORY_CARE_PROVIDER_SITE_OTHER): Payer: Self-pay

## 2023-03-09 VITALS — Ht 74.0 in | Wt 225.0 lb

## 2023-03-09 DIAGNOSIS — H6123 Impacted cerumen, bilateral: Secondary | ICD-10-CM

## 2023-03-10 ENCOUNTER — Encounter (INDEPENDENT_AMBULATORY_CARE_PROVIDER_SITE_OTHER): Payer: Self-pay

## 2023-03-11 DIAGNOSIS — H6123 Impacted cerumen, bilateral: Secondary | ICD-10-CM | POA: Insufficient documentation

## 2023-03-11 NOTE — Progress Notes (Unsigned)
Patient ID: [name], male   DOB: 1955-08-26, 67 y.o.   MRN: [id number]  Procedure: Bilateral cerumen disimpaction.    Indication: Cerumen impaction, resulting in ear discomfort and conductive hearing loss.    Description: The patient is placed supine on the exam table.  Under the operating microscope, the right ear canal is examined and is noted to be completely impacted with cerumen.  The cerumen is carefully removed with a combination of suction catheters, cerumen curette, and alligator forceps.  After the cerumen removal, the ear canal and tympanic membrane are noted to be normal.  No middle ear effusion is noted.  The same procedure is then repeated on the left side without exception. The patient tolerated the procedure well.  Follow-up care:  The patient is instructed not to use Q-tips to clean the ear canals.  The patient will follow up in 5 months.

## 2023-03-14 ENCOUNTER — Ambulatory Visit (INDEPENDENT_AMBULATORY_CARE_PROVIDER_SITE_OTHER): Payer: Medicare Other | Admitting: Gastroenterology

## 2023-03-14 ENCOUNTER — Encounter (INDEPENDENT_AMBULATORY_CARE_PROVIDER_SITE_OTHER): Payer: Self-pay | Admitting: Gastroenterology

## 2023-03-14 VITALS — BP 119/75 | HR 66 | Temp 97.9°F | Ht 74.0 in | Wt 225.6 lb

## 2023-03-14 DIAGNOSIS — R1310 Dysphagia, unspecified: Secondary | ICD-10-CM | POA: Diagnosis not present

## 2023-03-14 DIAGNOSIS — D1803 Hemangioma of intra-abdominal structures: Secondary | ICD-10-CM

## 2023-03-14 DIAGNOSIS — R1319 Other dysphagia: Secondary | ICD-10-CM

## 2023-03-14 NOTE — Patient Instructions (Addendum)
-  Schedule EGD --Cut food in small pieces and chew food thoroughly to avoid choking episodes.

## 2023-03-14 NOTE — Progress Notes (Signed)
Katrinka Blazing, M.D. Gastroenterology & Hepatology [facility] Gastroenterology [address] [place], [place] [address]  Primary Care Physician: Aliene Beams, MD [id number] [place] [address]  I will communicate my assessment and recommendations to the referring MD via EMR.  Problems: Dysphagia Family history of colon cancer Giant liver hemangioma  History of Present Illness: [name] is a 67 y.o. male with past medical history of A-fib, BPH, liver hemangioma, OSA, who presents for evaluation of dysphagia.  Patient reports that he had a history of esophageal strictures in his late 30s. He states he underwent a dilation of his esophagus.  States he did well, but for the last 5-7 years he had some intermittent episodes of dysphagia, which he has managed by cutting food in small pieces and chewing food thoroughly. States that for the last year his symptoms have worsened, as he has felt food gets stuck once a month and he has to vomit for the symptoms to resolve. There is no specific food that causes his symptoms, but only happens with solids. No odynophagia or dysphagia.  The patient denies having any nausea, vomiting, fever, chills, hematochezia, melena, hematemesis, abdominal distention, abdominal pain, diarrhea, jaundice, pruritus. Had some health issues this year which led to losing 10 lb.  Last EGD: possibly 30 years ago, had dilation - no report available Last Colonoscopy: 08/27/2021 Presence of internal and external hemorrhoids Recommended repeat colonoscopy in 5 years given history of colon polyps and family history of colon cancer (father diagnosed at age 63)  Past Medical History: Past Medical History:  Diagnosis Date   A-fib [facility])    a.) CHA2DS2VASc = 2 (age, vascular disease history);  b.) rate/rhythm maintained on oral metoprolol tartrate; no chronic anticoagulation   Adenomatous polyps    Aortic atherosclerosis  (HCC)    Bladder stones    BPH with urinary obstruction    Complication of anesthesia    a.) acute postoperative urinary retention in 07/2022 following foot surgery at [facility] --> required visit to ED and placement of foley catheter x 2 months   Erectile dysfunction    a.) on PDE5i (sildenafil)   Family history of colon cancer    Foot lesion    Hepatic steatosis    History of kidney stones    Liver hemangioma    OSA (obstructive sleep apnea) 10/01/2022   a.) s/p PSG 10/01/2022 --> moderate sleep apnea (AHI 15.7/hr); awaiting CPAP machine   S/P dilatation of esophageal stricture    Syncope and collapse 08/2022   a.) s/p initiation of flomax + finasteride  --> fell and hit head sustaining laceration that ultimately required skin closure with staples    Past Surgical History: Past Surgical History:  Procedure Laterality Date   BACK SURGERY  2003   lumbar-ruptured disc   COLONOSCOPY  06/23/2011   Procedure: COLONOSCOPY;  Surgeon: Malissa Hippo, MD;  [facility];  Service: Endoscopy;  Laterality: N/A;  3:00   COLONOSCOPY N/A 07/15/2016   Procedure: COLONOSCOPY;  Surgeon: Malissa Hippo, MD;  [facility];  Service: Endoscopy;  Laterality: N/A;  830   COLONOSCOPY WITH PROPOFOL N/A 08/27/2021   Procedure: COLONOSCOPY WITH PROPOFOL;  Surgeon: Malissa Hippo, MD;  [facility];  Service: Endoscopy;  Laterality: N/A;  730   CYSTOSCOPY W/ URETERAL STENT PLACEMENT Left 10/16/2022   Procedure: CYSTOSCOPY WITH STENT PLACEMENT;  Surgeon: Despina Arias, MD;  [facility];  Service: Urology;  Laterality: Left;   CYSTOSCOPY WITH LITHOLAPAXY N/A 10/22/2022   Procedure: CYSTOSCOPY WITH LITHOLAPAXY;  Surgeon: Sondra Come, MD;  Location: [facility];  Service: Urology;  Laterality: N/A;   ESOPHAGOGASTRODUODENOSCOPY     HOLEP-LASER ENUCLEATION OF THE PROSTATE WITH MORCELLATION N/A 10/22/2022   Procedure: HOLEP-LASER ENUCLEATION OF THE PROSTATE WITH MORCELLATION;   Surgeon: Sondra Come, MD;  Location: [facility];  Service: Urology;  Laterality: N/A;   left lytic foot lesion, left cuboid Left 07/2022   [facility]   URETEROSCOPY Left 10/22/2022   Procedure: URETEROSCOPY WITH STENT REMOVAL;  Surgeon: Sondra Come, MD;  Location: [facility];  Service: Urology;  Laterality: Left;    Family History: Family History  Problem Relation Age of Onset   Cancer Father     Social History: Social History   Tobacco Use  Smoking Status Never   Passive exposure: Never  Smokeless Tobacco Never   Social History   Substance and Sexual Activity  Alcohol Use No   Comment: occasional glass of wine   Social History   Substance and Sexual Activity  Drug Use No    Allergies: No Known Allergies  Medications: Current Outpatient Medications  Medication Sig Dispense Refill   apixaban (ELIQUIS) 5 MG TABS tablet Take 1 tablet (5 mg total) by mouth 2 (two) times daily. 60 tablet 11   metoprolol tartrate (LOPRESSOR) 25 MG tablet Take 1 tablet (25 mg total) by mouth 2 (two) times daily. 90 tablet 3   sildenafil (VIAGRA) 100 MG tablet 1/2 to 1 tab po prn 30 tablet 99   No current facility-administered medications for this visit.    Review of Systems: GENERAL: negative for malaise, night sweats HEENT: No changes in hearing or vision, no nose bleeds or other nasal problems. NECK: Negative for lumps, goiter, pain and significant neck swelling RESPIRATORY: Negative for cough, wheezing CARDIOVASCULAR: Negative for chest pain, leg swelling, palpitations, orthopnea GI: SEE HPI MUSCULOSKELETAL: Negative for joint pain or swelling, back pain, and muscle pain. SKIN: Negative for lesions, rash PSYCH: Negative for sleep disturbance, mood disorder and recent psychosocial stressors. HEMATOLOGY Negative for prolonged bleeding, bruising easily, and swollen nodes. ENDOCRINE: Negative for cold or heat intolerance, polyuria, polydipsia and goiter. NEURO: negative for tremor,  gait imbalance, syncope and seizures. The remainder of the review of systems is noncontributory.   Physical Exam: BP 119/75 (BP Location: Left Arm, Patient Position: Sitting, Cuff Size: Normal)   Pulse 66   Temp 97.9 F (36.6 C) (Oral)   Ht 6\' 2"  (1.88 m)   Wt 225 lb 9.6 oz (102.3 kg)   BMI 28.97 kg/m  GENERAL: The patient is AO x3, in no acute distress. HEENT: Head is normocephalic and atraumatic. EOMI are intact. Mouth is well hydrated and without lesions. NECK: Supple. No masses LUNGS: Clear to auscultation. No presence of rhonchi/wheezing/rales. Adequate chest expansion HEART: RRR, normal s1 and s2. ABDOMEN: Soft, nontender, no guarding, no peritoneal signs, and nondistended. BS +. No masses. EXTREMITIES: Without any cyanosis, clubbing, rash, lesions or edema. NEUROLOGIC: AOx3, no focal motor deficit. SKIN: no jaundice, no rashes  Imaging/Labs: as above  I personally reviewed and interpreted the available labs, imaging and endoscopic files.  Impression and Plan: [name] is a 67 y.o. male with past medical history of A-fib, BPH, liver hemangioma, OSA, who presents for evaluation of dysphagia.  The patient has presented chronic issues with dysphagia for multiple years, which have aggravated recently.  He denies having  any symptoms concerning for GERD.  Will need to evaluate this further with an EGD, which he is agreeable to proceed with.  Depending on findings, he may need to start acid suppression regimen.  He will need to continue dysphagia related recommendations to prevent choking events.  He had presence of an hemangioma in the posterior area of the liver.  This was seen on a CT scan and MRI in the past.  He does not present any significant abdominal pain.  May consider repeating imaging if he presents new onset of pain in his abdomen, around the area of the liver.  For now, we will continue to observe clinically.  -Schedule EGD -Cut food in small pieces and chew food  thoroughly to avoid choking episodes.  All questions were answered.      Katrinka Blazing, MD Gastroenterology and Hepatology [facility] Gastroenterology

## 2023-03-14 NOTE — H&P (View-Only) (Signed)
Katrinka Blazing, M.D. Gastroenterology & Hepatology [facility] Gastroenterology [address] [place], [place] [address]  Primary Care Physician: Aliene Beams, MD [address] [place] [address]  I will communicate my assessment and recommendations to the referring MD via EMR.  Problems: Dysphagia Family history of colon cancer Giant liver hemangioma  History of Present Illness: [name] is a 67 y.o. male with past medical history of A-fib, BPH, liver hemangioma, OSA, who presents for evaluation of dysphagia.  Patient reports that he had a history of esophageal strictures in his late 30s. He states he underwent a dilation of his esophagus.  States he did well, but for the last 5-7 years he had some intermittent episodes of dysphagia, which he has managed by cutting food in small pieces and chewing food thoroughly. States that for the last year his symptoms have worsened, as he has felt food gets stuck once a month and he has to vomit for the symptoms to resolve. There is no specific food that causes his symptoms, but only happens with solids. No odynophagia or dysphagia.  The patient denies having any nausea, vomiting, fever, chills, hematochezia, melena, hematemesis, abdominal distention, abdominal pain, diarrhea, jaundice, pruritus. Had some health issues this year which led to losing 10 lb.  Last EGD: possibly 30 years ago, had dilation - no report available Last Colonoscopy: 08/27/2021 Presence of internal and external hemorrhoids Recommended repeat colonoscopy in 5 years given history of colon polyps and family history of colon cancer (father diagnosed at age 63)  Past Medical History: Past Medical History:  Diagnosis Date   A-fib [facility])    a.) CHA2DS2VASc = 2 (age, vascular disease history);  b.) rate/rhythm maintained on oral metoprolol tartrate; no chronic anticoagulation   Adenomatous polyps    Aortic atherosclerosis  (HCC)    Bladder stones    BPH with urinary obstruction    Complication of anesthesia    a.) acute postoperative urinary retention in 07/2022 following foot surgery at [facility] --> required visit to ED and placement of foley catheter x 2 months   Erectile dysfunction    a.) on PDE5i (sildenafil)   Family history of colon cancer    Foot lesion    Hepatic steatosis    History of kidney stones    Liver hemangioma    OSA (obstructive sleep apnea) 10/01/2022   a.) s/p PSG 10/01/2022 --> moderate sleep apnea (AHI 15.7/hr); awaiting CPAP machine   S/P dilatation of esophageal stricture    Syncope and collapse 08/2022   a.) s/p initiation of flomax + finasteride  --> fell and hit head sustaining laceration that ultimately required skin closure with staples    Past Surgical History: Past Surgical History:  Procedure Laterality Date   BACK SURGERY  2003   lumbar-ruptured disc   COLONOSCOPY  06/23/2011   Procedure: COLONOSCOPY;  Surgeon: Malissa Hippo, MD;  [facility];  Service: Endoscopy;  Laterality: N/A;  3:00   COLONOSCOPY N/A 07/15/2016   Procedure: COLONOSCOPY;  Surgeon: Malissa Hippo, MD;  [facility];  Service: Endoscopy;  Laterality: N/A;  830   COLONOSCOPY WITH PROPOFOL N/A 08/27/2021   Procedure: COLONOSCOPY WITH PROPOFOL;  Surgeon: Malissa Hippo, MD;  [facility];  Service: Endoscopy;  Laterality: N/A;  730   CYSTOSCOPY W/ URETERAL STENT PLACEMENT Left 10/16/2022   Procedure: CYSTOSCOPY WITH STENT PLACEMENT;  Surgeon: Despina Arias, MD;  Location: [facility];  Service: Urology;  Laterality: Left;   CYSTOSCOPY WITH LITHOLAPAXY N/A 10/22/2022   Procedure: CYSTOSCOPY WITH LITHOLAPAXY;  Surgeon: Sondra Come, MD;  Location: [facility] OR[facility];  Service: Urology;  Laterality: N/A;   ESOPHAGOGASTRODUODENOSCOPY     HOLEP-LASER ENUCLEATION OF THE PROSTATE WITH MORCELLATION N/A 10/22/2022   Procedure: HOLEP-LASER ENUCLEATION OF THE PROSTATE WITH MORCELLATION;   Surgeon: Sondra Come, MD;  Location: [facility] ORS;  Service: Urology;  Laterality: N/A;   left lytic foot lesion, left cuboid Left 07/2022   [facility]   URETEROSCOPY Left 10/22/2022   Procedure: URETEROSCOPY WITH STENT REMOVAL;  Surgeon: Sondra Come, MD;  Location: [facility] ORS;  Service: Urology;  Laterality: Left;    Family History: Family History  Problem Relation Age of Onset   Cancer Father     Social History: Social History   Tobacco Use  Smoking Status Never   Passive exposure: Never  Smokeless Tobacco Never   Social History   Substance and Sexual Activity  Alcohol Use No   Comment: occasional glass of wine   Social History   Substance and Sexual Activity  Drug Use No    Allergies: No Known Allergies  Medications: Current Outpatient Medications  Medication Sig Dispense Refill   apixaban (ELIQUIS) 5 MG TABS tablet Take 1 tablet (5 mg total) by mouth 2 (two) times daily. 60 tablet 11   metoprolol tartrate (LOPRESSOR) 25 MG tablet Take 1 tablet (25 mg total) by mouth 2 (two) times daily. 90 tablet 3   sildenafil (VIAGRA) 100 MG tablet 1/2 to 1 tab po prn 30 tablet 99   No current facility-administered medications for this visit.    Review of Systems: GENERAL: negative for malaise, night sweats HEENT: No changes in hearing or vision, no nose bleeds or other nasal problems. NECK: Negative for lumps, goiter, pain and significant neck swelling RESPIRATORY: Negative for cough, wheezing CARDIOVASCULAR: Negative for chest pain, leg swelling, palpitations, orthopnea GI: SEE HPI MUSCULOSKELETAL: Negative for joint pain or swelling, back pain, and muscle pain. SKIN: Negative for lesions, rash PSYCH: Negative for sleep disturbance, mood disorder and recent psychosocial stressors. HEMATOLOGY Negative for prolonged bleeding, bruising easily, and swollen nodes. ENDOCRINE: Negative for cold or heat intolerance, polyuria, polydipsia and goiter. NEURO: negative for tremor,  gait imbalance, syncope and seizures. The remainder of the review of systems is noncontributory.   Physical Exam: BP 119/75 (BP Location: Left Arm, Patient Position: Sitting, Cuff Size: Normal)   Pulse 66   Temp 97.9 F (36.6 C) (Oral)   Ht 6\' 2"  (1.88 m)   Wt 225 lb 9.6 oz (102.3 kg)   BMI 28.97 kg/m  GENERAL: The patient is AO x3, in no acute distress. HEENT: Head is normocephalic and atraumatic. EOMI are intact. Mouth is well hydrated and without lesions. NECK: Supple. No masses LUNGS: Clear to auscultation. No presence of rhonchi/wheezing/rales. Adequate chest expansion HEART: RRR, normal s1 and s2. ABDOMEN: Soft, nontender, no guarding, no peritoneal signs, and nondistended. BS +. No masses. EXTREMITIES: Without any cyanosis, clubbing, rash, lesions or edema. NEUROLOGIC: AOx3, no focal motor deficit. SKIN: no jaundice, no rashes  Imaging/Labs: as above  I personally reviewed and interpreted the available labs, imaging and endoscopic files.  Impression and Plan: [name] is a 67 y.o. male with past medical history of A-fib, BPH, liver hemangioma, OSA, who presents for evaluation of dysphagia.  The patient has presented chronic issues with dysphagia for multiple years, which have aggravated recently.  He denies having  any symptoms concerning for GERD.  Will need to evaluate this further with an EGD, which he is agreeable to proceed with.  Depending on findings, he may need to start acid suppression regimen.  He will need to continue dysphagia related recommendations to prevent choking events.  He had presence of an hemangioma in the posterior area of the liver.  This was seen on a CT scan and MRI in the past.  He does not present any significant abdominal pain.  May consider repeating imaging if he presents new onset of pain in his abdomen, around the area of the liver.  For now, we will continue to observe clinically.  -Schedule EGD -Cut food in small pieces and chew food  thoroughly to avoid choking episodes.  All questions were answered.      Katrinka Blazing, MD Gastroenterology and Hepatology [facility] Gastroenterology

## 2023-03-15 ENCOUNTER — Telehealth: Payer: Self-pay | Admitting: *Deleted

## 2023-03-15 ENCOUNTER — Telehealth (INDEPENDENT_AMBULATORY_CARE_PROVIDER_SITE_OTHER): Payer: Self-pay | Admitting: Gastroenterology

## 2023-03-15 NOTE — Telephone Encounter (Signed)
Pt has been scheduled for tele pre op appt 03/25/23. Med rec and consent are done.

## 2023-03-15 NOTE — Telephone Encounter (Signed)
Name: [name]  DOB: 05-21-1956  MRN: [id number]  Primary Cardiologist: Marjo Bicker, MD   Preoperative team, please contact this patient and set up a phone call appointment for further preoperative risk assessment. Please obtain consent and complete medication review. Thank you for your help.  I confirm that guidance regarding antiplatelet and oral anticoagulation therapy has been completed and, if necessary, noted below.  Patient can hold Eliquis for 2 days prior to procedure as requested.   I also confirmed the patient resides in the state of [place]. As per [facility] [employer] telemedicine laws, the patient must reside in the state in which the provider is licensed.   Ronney Asters, NP 03/15/2023, 10:53 AM [facility]

## 2023-03-15 NOTE — Telephone Encounter (Signed)
Pt has been scheduled for tele preop appt 03/25/23. Med rec and consent are done.     Patient Consent for Virtual Visit        [name] has provided verbal consent on 03/15/2023 for a virtual visit (video or telephone).   CONSENT FOR VIRTUAL VISIT FOR:  [name]  By participating in this virtual visit I agree to the following:  I hereby voluntarily request, consent and authorize [facility] and its employed or contracted physicians, physician assistants, nurse practitioners or other licensed health care professionals (the Practitioner), to provide me with telemedicine health care services (the "Services") as deemed necessary by the treating Practitioner. I acknowledge and consent to receive the Services by the Practitioner via telemedicine. I understand that the telemedicine visit will involve communicating with the Practitioner through live audiovisual communication technology and the disclosure of certain medical information by electronic transmission. I acknowledge that I have been given the opportunity to request an in-person assessment or other available alternative prior to the telemedicine visit and am voluntarily participating in the telemedicine visit.  I understand that I have the right to withhold or withdraw my consent to the use of telemedicine in the course of my care at any time, without affecting my right to future care or treatment, and that the Practitioner or I may terminate the telemedicine visit at any time. I understand that I have the right to inspect all information obtained and/or recorded in the course of the telemedicine visit and may receive copies of available information for a reasonable fee.  I understand that some of the potential risks of receiving the Services via telemedicine include:  Delay or interruption in medical evaluation due to technological equipment failure or disruption; Information transmitted may not be sufficient (e.g. poor  resolution of images) to allow for appropriate medical decision making by the Practitioner; and/or  In rare instances, security protocols could fail, causing a breach of personal health information.  Furthermore, I acknowledge that it is my responsibility to provide information about my medical history, conditions and care that is complete and accurate to the best of my ability. I acknowledge that Practitioner's advice, recommendations, and/or decision may be based on factors not within their control, such as incomplete or inaccurate data provided by me or distortions of diagnostic images or specimens that may result from electronic transmissions. I understand that the practice of medicine is not an exact science and that Practitioner makes no warranties or guarantees regarding treatment outcomes. I acknowledge that a copy of this consent can be made available to me via my patient portal [facility] MyChart), or I can request a printed copy by calling the office of [facility].    I understand that my insurance will be billed for this visit.   I have read or had this consent read to me. I understand the contents of this consent, which adequately explains the benefits and risks of the Services being provided via telemedicine.  I have been provided ample opportunity to ask questions regarding this consent and the Services and have had my questions answered to my satisfaction. I give my informed consent for the services to be provided through the use of telemedicine in my medical care

## 2023-03-15 NOTE — Telephone Encounter (Signed)
Patient with diagnosis of afib on Eliquis for anticoagulation.    Procedure: EGD Date of procedure: TBD  CHA2DS2-VASc Score = 1  This indicates a 0.6% annual risk of stroke. The patient's score is based upon: CHF History: 0 HTN History: 0 Diabetes History: 0 Stroke History: 0 Vascular Disease History: 0 Age Score: 1 Gender Score: 0     CrCl 79mL/min Platelet count 228K  Patient can hold Eliquis for 2 days prior to procedure as requested.    **This guidance is not considered finalized until pre-operative APP has relayed final recommendations.**

## 2023-03-15 NOTE — Telephone Encounter (Signed)
03/15/23  [name] [birth year]  What type of surgery is being performed? EGD  When is surgery scheduled? TBD  Clearance to hold Eliquis for 2 days prior  Name of physician performing surgery?  Dr. Katrinka Blazing [facility] Gastroenterology at [facility] Phone: [phone] Fax: [phone]  Anethesia type (none, local, MAC, general)? MAC

## 2023-03-25 ENCOUNTER — Ambulatory Visit: Payer: Medicare Other | Attending: Cardiovascular Disease | Admitting: Student

## 2023-03-25 ENCOUNTER — Other Ambulatory Visit: Payer: Self-pay | Admitting: Surgery

## 2023-03-25 DIAGNOSIS — Z01810 Encounter for preprocedural cardiovascular examination: Secondary | ICD-10-CM

## 2023-03-25 DIAGNOSIS — D1803 Hemangioma of intra-abdominal structures: Secondary | ICD-10-CM

## 2023-03-25 NOTE — Progress Notes (Signed)
Virtual Visit via Telephone Note   Because of [name]'s co-morbid illnesses, he is at least at moderate risk for complications without adequate follow up.  This format is felt to be most appropriate for this patient at this time.  The patient did not have access to video technology/had technical difficulties with video requiring transitioning to audio format only (telephone).  All issues noted in this document were discussed and addressed.  No physical exam could be performed with this format.  Please refer to the patient's chart for his consent to telehealth for [facility].  Evaluation Performed:  Preoperative cardiovascular risk assessment _____________   Date:  03/25/2023   Patient ID:  [name], DOB Mar 12, 1956, MRN [id number] Patient Location:  Home Provider location:   Office  Primary Care Provider:  Aliene Beams, MD Primary Cardiologist:  Marjo Bicker, MD  Chief Complaint / Patient Profile   67 y.o. y/o male with a h/o PAF on anticoagulation, OSA on CPAP, syncope who is pending EGD by Dr. Levon Hedger and presents today for telephonic preoperative cardiovascular risk assessment.  History of Present Illness    [name] is a 67 y.o. male who presents via audio/video conferencing for a telehealth visit today.  Pt was last seen in cardiology clinic on 12/29/2022 by Dr. Jenene Slicker.  At that time [name] was stable from a cardiac standpoint.  The patient is now pending procedure as outlined above. Since his last visit, he is doing well. Patient denies shortness of breath, dyspnea on exertion, lower extremity edema, orthopnea or PND. No chest pain, pressure, or tightness. No palpitations.  He stays active walking 3 days a week and working in his yard.   Past Medical History    Past Medical History:  Diagnosis Date   A-fib [facility])    a.) CHA2DS2VASc = 2 (age, vascular disease history);  b.) rate/rhythm maintained on oral metoprolol tartrate; no  chronic anticoagulation   Adenomatous polyps    Aortic atherosclerosis (HCC)    Bladder stones    BPH with urinary obstruction    Complication of anesthesia    a.) acute postoperative urinary retention in 07/2022 following foot surgery at [facility] --> required visit to ED and placement of foley catheter x 2 months   Erectile dysfunction    a.) on PDE5i (sildenafil)   Family history of colon cancer    Foot lesion    Hepatic steatosis    History of kidney stones    Liver hemangioma    OSA (obstructive sleep apnea) 10/01/2022   a.) s/p PSG 10/01/2022 --> moderate sleep apnea (AHI 15.7/hr); awaiting CPAP machine   S/P dilatation of esophageal stricture    Syncope and collapse 08/2022   a.) s/p initiation of flomax + finasteride  --> fell and hit head sustaining laceration that ultimately required skin closure with staples   Past Surgical History:  Procedure Laterality Date   BACK SURGERY  2003   lumbar-ruptured disc   COLONOSCOPY  06/23/2011   Procedure: COLONOSCOPY;  Surgeon: Malissa Hippo, MD;  [facility];  Service: Endoscopy;  Laterality: N/A;  3:00   COLONOSCOPY N/A 07/15/2016   Procedure: COLONOSCOPY;  Surgeon: Malissa Hippo, MD;  [facility];  Service: Endoscopy;  Laterality: N/A;  830   COLONOSCOPY WITH PROPOFOL N/A 08/27/2021   Procedure: COLONOSCOPY WITH PROPOFOL;  Surgeon: Malissa Hippo, MD;  [facility];  Service: Endoscopy;  Laterality: N/A;  730  CYSTOSCOPY W/ URETERAL STENT PLACEMENT Left 10/16/2022   Procedure: CYSTOSCOPY WITH STENT PLACEMENT;  Surgeon: Despina Arias, MD;  [facility];  Service: Urology;  Laterality: Left;   CYSTOSCOPY WITH LITHOLAPAXY N/A 10/22/2022   Procedure: CYSTOSCOPY WITH LITHOLAPAXY;  Surgeon: Sondra Come, MD;  Location: [facility] OR[facility];  Service: Urology;  Laterality: N/A;   ESOPHAGOGASTRODUODENOSCOPY     HOLEP-LASER ENUCLEATION OF THE PROSTATE WITH MORCELLATION N/A 10/22/2022   Procedure: HOLEP-LASER  ENUCLEATION OF THE PROSTATE WITH MORCELLATION;  Surgeon: Sondra Come, MD;  Location: [facility] OR[facility];  Service: Urology;  Laterality: N/A;   left lytic foot lesion, left cuboid Left 07/2022   [facility]   URETEROSCOPY Left 10/22/2022   Procedure: URETEROSCOPY WITH STENT REMOVAL;  Surgeon: Sondra Come, MD;  Location: [facility] ORS;  Service: Urology;  Laterality: Left;    Allergies  No Known Allergies  Home Medications    Prior to Admission medications   Medication Sig Start Date End Date Taking? Authorizing Provider  apixaban (ELIQUIS) 5 MG TABS tablet Take 1 tablet (5 mg total) by mouth 2 (two) times daily. 10/25/22   Mallipeddi, Vishnu P, MD  metoprolol tartrate (LOPRESSOR) 25 MG tablet Take 1 tablet (25 mg total) by mouth 2 (two) times daily. 10/25/22 03/15/23  Mallipeddi, Orion Modest, MD  sildenafil (VIAGRA) 100 MG tablet 1/2 to 1 tab po prn 01/26/22   Marcine Matar, MD    Physical Exam    Vital Signs:  [name] does not have vital signs available for review today.  Given telephonic nature of communication, physical exam is limited. AAOx3. NAD. Normal affect.  Speech and respirations are unlabored.  Accessory Clinical Findings    None  Assessment & Plan    Primary Cardiologist: Vishnu Norton Pastel, MD  Preoperative cardiovascular risk assessment.  EGD by Dr. Levon Hedger.  Chart reviewed as part of pre-operative protocol coverage. According to the RCRI, patient has a 0.4% risk of MACE. Patient reports activity equivalent to 4.0 METS (walks 3 days a week and works in the yard).   Given past medical history and time since last visit, based on ACC/AHA guidelines, [name] would be at acceptable risk for the planned procedure without further cardiovascular testing.   Patient was advised that if he develops new symptoms prior to surgery to contact our office to arrange a follow-up appointment.  he verbalized understanding.  Per Pharm D, patient may hold Eliquis for 2 days prior  to procedure.    I will route this recommendation to the requesting party via Epic fax function.  Please call with questions.  Time:   Today, I have spent 5 minutes with the patient with telehealth technology discussing medical history, symptoms, and management plan.     Carlos Levering, NP  03/25/2023, 8:18 AM

## 2023-03-29 ENCOUNTER — Telehealth (INDEPENDENT_AMBULATORY_CARE_PROVIDER_SITE_OTHER): Payer: Self-pay | Admitting: Gastroenterology

## 2023-03-29 NOTE — Telephone Encounter (Signed)
Clearance from cardiology received to hold Eliquis for 2 days. Contacted pt but spoke with wife (OK per DPR) and asked if 04/06/23 at 11:30am would still be ok for pt since I was holding that spot for him. Wife states she will let him know. Instructions will be sent via my chart. No PA needed per insurance.

## 2023-04-06 ENCOUNTER — Ambulatory Visit (HOSPITAL_COMMUNITY)
Admission: RE | Admit: 2023-04-06 | Discharge: 2023-04-06 | Disposition: A | Discharge status: Home / Self Care | Payer: Medicare Other | Attending: Gastroenterology | Admitting: Gastroenterology

## 2023-04-06 ENCOUNTER — Encounter (HOSPITAL_COMMUNITY): Payer: Self-pay | Admitting: Gastroenterology

## 2023-04-06 ENCOUNTER — Encounter (HOSPITAL_COMMUNITY)
Admission: RE | Disposition: A | Discharge status: Home / Self Care | Payer: Self-pay | Source: Home / Self Care | Attending: Gastroenterology

## 2023-04-06 ENCOUNTER — Other Ambulatory Visit: Payer: Self-pay

## 2023-04-06 ENCOUNTER — Ambulatory Visit (HOSPITAL_COMMUNITY): Payer: Medicare Other | Admitting: Anesthesiology

## 2023-04-06 ENCOUNTER — Ambulatory Visit (HOSPITAL_BASED_OUTPATIENT_CLINIC_OR_DEPARTMENT_OTHER): Payer: Medicare Other | Admitting: Anesthesiology

## 2023-04-06 DIAGNOSIS — K222 Esophageal obstruction: Secondary | ICD-10-CM

## 2023-04-06 DIAGNOSIS — G4733 Obstructive sleep apnea (adult) (pediatric): Secondary | ICD-10-CM | POA: Diagnosis not present

## 2023-04-06 DIAGNOSIS — K317 Polyp of stomach and duodenum: Secondary | ICD-10-CM | POA: Diagnosis not present

## 2023-04-06 DIAGNOSIS — R1310 Dysphagia, unspecified: Secondary | ICD-10-CM

## 2023-04-06 DIAGNOSIS — K3189 Other diseases of stomach and duodenum: Secondary | ICD-10-CM

## 2023-04-06 DIAGNOSIS — K449 Diaphragmatic hernia without obstruction or gangrene: Secondary | ICD-10-CM | POA: Diagnosis not present

## 2023-04-06 DIAGNOSIS — K648 Other hemorrhoids: Secondary | ICD-10-CM | POA: Insufficient documentation

## 2023-04-06 DIAGNOSIS — I700 Atherosclerosis of aorta: Secondary | ICD-10-CM | POA: Insufficient documentation

## 2023-04-06 DIAGNOSIS — I4891 Unspecified atrial fibrillation: Secondary | ICD-10-CM | POA: Insufficient documentation

## 2023-04-06 DIAGNOSIS — K277 Chronic peptic ulcer, site unspecified, without hemorrhage or perforation: Secondary | ICD-10-CM | POA: Diagnosis not present

## 2023-04-06 DIAGNOSIS — G4730 Sleep apnea, unspecified: Secondary | ICD-10-CM | POA: Diagnosis not present

## 2023-04-06 HISTORY — PX: BALLOON DILATION: SHX5330

## 2023-04-06 HISTORY — PX: ESOPHAGOGASTRODUODENOSCOPY (EGD) WITH PROPOFOL: SHX5813

## 2023-04-06 HISTORY — PX: BIOPSY: SHX5522

## 2023-04-06 SURGERY — ESOPHAGOGASTRODUODENOSCOPY (EGD) WITH PROPOFOL
Anesthesia: General

## 2023-04-06 MED ORDER — PROPOFOL 10 MG/ML IV BOLUS
INTRAVENOUS | Status: DC | PRN
  Administered 2023-04-06: 100 mg via INTRAVENOUS
  Administered 2023-04-06: 50 mg via INTRAVENOUS

## 2023-04-06 MED ORDER — LIDOCAINE HCL (PF) 2 % IJ SOLN
INTRAMUSCULAR | Status: DC | PRN
  Administered 2023-04-06: 50 mg via INTRADERMAL

## 2023-04-06 MED ORDER — LACTATED RINGERS IV SOLN: INTRAVENOUS | Status: DC | PRN

## 2023-04-06 MED ORDER — PROPOFOL 500 MG/50ML IV EMUL
INTRAVENOUS | Status: DC | PRN
  Administered 2023-04-06: 150 ug/kg/min via INTRAVENOUS

## 2023-04-06 MED ORDER — OMEPRAZOLE 20 MG PO CPDR: 20.0000 mg | DELAYED_RELEASE_CAPSULE | Freq: Every day | ORAL | 3 refills | Status: DC

## 2023-04-06 NOTE — Anesthesia Preprocedure Evaluation (Addendum)
Anesthesia Evaluation  Patient identified by MRN, date of birth, ID band Patient awake    Reviewed: Allergy & Precautions, NPO status , Patient's Chart, lab work & pertinent test results  Airway Mallampati: II  TM Distance: >3 FB Neck ROM: Full    Dental no notable dental hx. (+) Dental Advisory Given, Teeth Intact   Pulmonary sleep apnea    Pulmonary exam normal breath sounds clear to auscultation       Cardiovascular Normal cardiovascular exam+ dysrhythmias Atrial Fibrillation  Rhythm:Regular Rate:Normal  Syncope with collapse.  Aortic atherosclerosis   Neuro/Psych negative neurological ROS  negative psych ROS   GI/Hepatic Neg liver ROS,,,Hepatic steratosis Esophageal stricture   Endo/Other  negative endocrine ROS    Renal/GU negative Renal ROS  negative genitourinary   Musculoskeletal negative musculoskeletal ROS (+)    Abdominal   Peds negative pediatric ROS (+)  Hematology negative hematology ROS (+)   Anesthesia Other Findings   Reproductive/Obstetrics negative OB ROS                             Anesthesia Physical Anesthesia Plan  ASA: 3  Anesthesia Plan: General   Post-op Pain Management: Minimal or no pain anticipated   Induction: Intravenous  PONV Risk Score and Plan: Propofol infusion  Airway Management Planned: Nasal Cannula and Natural Airway  Additional Equipment: None  Intra-op Plan:   Post-operative Plan:   Informed Consent: I have reviewed the patients History and Physical, chart, labs and discussed the procedure including the risks, benefits and alternatives for the proposed anesthesia with the patient or authorized representative who has indicated his/her understanding and acceptance.     Dental advisory given  Plan Discussed with: CRNA  Anesthesia Plan Comments:         Anesthesia Quick Evaluation

## 2023-04-06 NOTE — Op Note (Signed)
[facility] Patient Name: [name] Procedure Date: 04/06/2023 1:04 PM MRN: [id number] Date of Birth: [birth year] Attending MD: Katrinka Blazing , , [id number] CSN: [id number] Age: 67 Admit Type: Outpatient Procedure:                Upper GI endoscopy Indications:              Dysphagia Providers:                Katrinka Blazing, Francoise Ceo RN, RN, Crystal Page,                            Dyann Ruddle Referring MD:              Medicines:                Monitored Anesthesia Care Complications:            No immediate complications. Estimated Blood Loss:     Estimated blood loss: none. Procedure:                Pre-Anesthesia Assessment:                           - Prior to the procedure, a History and Physical                            was performed, and patient medications, allergies                            and sensitivities were reviewed. The patient's                            tolerance of previous anesthesia was reviewed.                           - The risks and benefits of the procedure and the                            sedation options and risks were discussed with the                            patient. All questions were answered and informed                            consent was obtained.                           - ASA Grade Assessment: II - A patient with mild                            systemic disease.                           After obtaining informed consent, the endoscope was                            passed under direct vision. Throughout the  procedure, the patient's blood pressure, pulse, and                            oxygen saturations were monitored continuously. The                            GIF-H190 ([id number]) scope was introduced through the                            mouth, and advanced to the second part of duodenum.                            The upper GI endoscopy was accomplished without                             difficulty. The patient tolerated the procedure                            well. Scope In: 1:20:34 PM Scope Out: 1:32:48 PM Total Procedure Duration: 0 hours 12 minutes 14 seconds  Findings:      A low-grade of narrowing Schatzki ring was found at the gastroesophageal       junction. A cold forceps was used to disrupt the rim of the ring. A TTS       dilator was passed through the scope. Dilation with a 15-16.5-18 mm       balloon dilator was performed to 18 mm. The dilation site was examined       and showed mild mucosal disruption.      A 2 cm hiatal hernia was present.      The gastroesophageal flap valve was visualized endoscopically and       classified as Hill Grade III (minimal fold, loose to endoscope, hiatal       hernia likely).      A few small sessile fundic gland polyps were found in the gastric fundus.      Localized nodular mucosa was found in the ampulla. Biopsies were taken       with a cold forceps for histology. Impression:               - Low-grade of narrowing Schatzki ring. Dilated.                           - 2 cm hiatal hernia.                           - A few gastric polyps.                           - Nodular mucosa in the ampulla. Biopsied. Moderate Sedation:      Per Anesthesia Care Recommendation:           - Discharge patient to home (ambulatory).                           - Resume previous diet.                           -  Await pathology results.                           - Use Prilosec (omeprazole) 20 mg PO daily                            indefinitely. Procedure Code(s):        --- Professional ---                           [phone], Esophagogastroduodenoscopy, flexible,                            transoral; with transendoscopic balloon dilation of                            esophagus (less than 30 mm diameter)                           43239, 59, Esophagogastroduodenoscopy, flexible,                            transoral; with biopsy, single or  multiple Diagnosis Code(s):        --- Professional ---                           K22.2, Esophageal obstruction                           K44.9, Diaphragmatic hernia without obstruction or                            gangrene                           K31.7, Polyp of stomach and duodenum                           K31.89, Other diseases of stomach and duodenum                           R13.10, Dysphagia, unspecified CPT copyright 2022 American Medical Association. All rights reserved. The codes documented in this report are preliminary and upon coder review may  be revised to meet current compliance requirements. Katrinka Blazing, MD Katrinka Blazing,  04/06/2023 1:50:00 PM This report has been signed electronically. Number of Addenda: 0

## 2023-04-06 NOTE — Discharge Instructions (Addendum)
You are being discharged to home.  Resume your previous diet.  We are waiting for your pathology results.  Take Prilosec (omeprazole) 20 mg by mouth once a day indefinitely.  Restart Eliquis tonight.

## 2023-04-06 NOTE — Anesthesia Postprocedure Evaluation (Signed)
Anesthesia Post Note  Patient: [name]  Procedure(s) Performed: ESOPHAGOGASTRODUODENOSCOPY (EGD) WITH PROPOFOL BALLOON DILATION BIOPSY  Patient location during evaluation: PACU Anesthesia Type: General Level of consciousness: awake and alert Pain management: pain level controlled Vital Signs Assessment: post-procedure vital signs reviewed and stable Respiratory status: spontaneous breathing, nonlabored ventilation, respiratory function stable and patient connected to nasal cannula oxygen Cardiovascular status: blood pressure returned to baseline and stable Postop Assessment: no apparent nausea or vomiting Anesthetic complications: no   There were no known notable events for this encounter.   Last Vitals:  Vitals:   04/06/23 1244 04/06/23 1336  BP: (!) 144/81 (!) 96/52  Pulse: (!) 59 63  Resp: 14 17  Temp: 36.6 C 36.6 C  SpO2: 99% 95%    Last Pain:  Vitals:   04/06/23 1336  TempSrc: Oral  PainSc: 0-No pain                 Kassius Battiste L Dawnna Gritz

## 2023-04-06 NOTE — Anesthesia Procedure Notes (Signed)
Date/Time: 04/06/2023 1:15 PM  Performed by: Julian Reil, CRNAPre-anesthesia Checklist: Patient identified, Emergency Drugs available, Suction available and Patient being monitored Patient Re-evaluated:Patient Re-evaluated prior to induction Oxygen Delivery Method: Nasal cannula Induction Type: IV induction Placement Confirmation: positive ETCO2

## 2023-04-06 NOTE — Transfer of Care (Signed)
Immediate Anesthesia Transfer of Care Note  Patient: [name]  Procedure(s) Performed: ESOPHAGOGASTRODUODENOSCOPY (EGD) WITH PROPOFOL BALLOON DILATION BIOPSY  Patient Location: Endoscopy Unit  Anesthesia Type:General  Level of Consciousness: drowsy  Airway & Oxygen Therapy: Patient Spontanous Breathing  Post-op Assessment: Report given to RN and Post -op Vital signs reviewed and stable  Post vital signs: Reviewed and stable  Last Vitals:  Vitals Value Taken Time  BP    Temp    Pulse    Resp    SpO2      Last Pain:  Vitals:   04/06/23 1316  TempSrc:   PainSc: 0-No pain      Patients Stated Pain Goal: 6 (04/06/23 1244)  Complications: No notable events documented.

## 2023-04-06 NOTE — Interval H&P Note (Signed)
History and Physical Interval Note:  04/06/2023 1:11 PM  [name]  has presented today for surgery, with the diagnosis of dysphagia.  The various methods of treatment have been discussed with the patient and family. After consideration of risks, benefits and other options for treatment, the patient has consented to  Procedure(s) with comments: ESOPHAGOGASTRODUODENOSCOPY (EGD) WITH PROPOFOL (N/A) - 11:30am;asa 1 as a surgical intervention.  The patient's history has been reviewed, patient examined, no change in status, stable for surgery.  I have reviewed the patient's chart and labs.  Questions were answered to the patient's satisfaction.     Katrinka Blazing Mayorga

## 2023-04-07 ENCOUNTER — Encounter (INDEPENDENT_AMBULATORY_CARE_PROVIDER_SITE_OTHER): Payer: Self-pay | Admitting: *Deleted

## 2023-04-07 LAB — SURGICAL PATHOLOGY

## 2023-04-13 ENCOUNTER — Encounter (HOSPITAL_COMMUNITY): Payer: Self-pay | Admitting: Gastroenterology

## 2023-04-13 DIAGNOSIS — K080 Exfoliation of teeth due to systemic causes: Secondary | ICD-10-CM | POA: Diagnosis not present

## 2023-04-14 DIAGNOSIS — M85572 Aneurysmal bone cyst, left ankle and foot: Secondary | ICD-10-CM | POA: Diagnosis not present

## 2023-04-14 DIAGNOSIS — M85579 Aneurysmal bone cyst, unspecified ankle and foot: Secondary | ICD-10-CM | POA: Diagnosis not present

## 2023-04-14 DIAGNOSIS — D480 Neoplasm of uncertain behavior of bone and articular cartilage: Secondary | ICD-10-CM | POA: Diagnosis not present

## 2023-04-15 ENCOUNTER — Ambulatory Visit
Admission: RE | Admit: 2023-04-15 | Discharge: 2023-04-15 | Disposition: A | Discharge status: Home / Self Care | Payer: Medicare Other | Source: Ambulatory Visit | Attending: Surgery

## 2023-04-15 DIAGNOSIS — D1803 Hemangioma of intra-abdominal structures: Secondary | ICD-10-CM | POA: Diagnosis not present

## 2023-04-15 DIAGNOSIS — K760 Fatty (change of) liver, not elsewhere classified: Secondary | ICD-10-CM | POA: Diagnosis not present

## 2023-04-15 MED ORDER — GADOPICLENOL 0.5 MMOL/ML IV SOLN
10.0000 mL | Freq: Once | INTRAVENOUS | Status: AC | PRN
Start: 2023-04-15 — End: 2023-04-15
  Administered 2023-04-15: 10 mL via INTRAVENOUS

## 2023-05-06 DIAGNOSIS — D1803 Hemangioma of intra-abdominal structures: Secondary | ICD-10-CM | POA: Diagnosis not present

## 2023-06-22 DIAGNOSIS — Z79899 Other long term (current) drug therapy: Secondary | ICD-10-CM | POA: Diagnosis not present

## 2023-06-22 DIAGNOSIS — Z125 Encounter for screening for malignant neoplasm of prostate: Secondary | ICD-10-CM | POA: Diagnosis not present

## 2023-06-22 DIAGNOSIS — Z136 Encounter for screening for cardiovascular disorders: Secondary | ICD-10-CM | POA: Diagnosis not present

## 2023-06-22 DIAGNOSIS — M25512 Pain in left shoulder: Secondary | ICD-10-CM | POA: Diagnosis not present

## 2023-06-22 DIAGNOSIS — Z0000 Encounter for general adult medical examination without abnormal findings: Secondary | ICD-10-CM | POA: Diagnosis not present

## 2023-06-22 DIAGNOSIS — I480 Paroxysmal atrial fibrillation: Secondary | ICD-10-CM | POA: Diagnosis not present

## 2023-06-22 DIAGNOSIS — D1803 Hemangioma of intra-abdominal structures: Secondary | ICD-10-CM | POA: Diagnosis not present

## 2023-07-07 ENCOUNTER — Ambulatory Visit: Payer: Medicare Other | Admitting: Internal Medicine

## 2023-07-19 DIAGNOSIS — M19011 Primary osteoarthritis, right shoulder: Secondary | ICD-10-CM | POA: Diagnosis not present

## 2023-07-19 DIAGNOSIS — M25511 Pain in right shoulder: Secondary | ICD-10-CM | POA: Diagnosis not present

## 2023-07-19 DIAGNOSIS — M19012 Primary osteoarthritis, left shoulder: Secondary | ICD-10-CM | POA: Diagnosis not present

## 2023-07-19 DIAGNOSIS — M25512 Pain in left shoulder: Secondary | ICD-10-CM | POA: Diagnosis not present

## 2023-07-21 DIAGNOSIS — D480 Neoplasm of uncertain behavior of bone and articular cartilage: Secondary | ICD-10-CM | POA: Diagnosis not present

## 2023-07-21 DIAGNOSIS — M85579 Aneurysmal bone cyst, unspecified ankle and foot: Secondary | ICD-10-CM | POA: Diagnosis not present

## 2023-07-21 DIAGNOSIS — M85572 Aneurysmal bone cyst, left ankle and foot: Secondary | ICD-10-CM | POA: Diagnosis not present

## 2023-07-26 DIAGNOSIS — M25512 Pain in left shoulder: Secondary | ICD-10-CM | POA: Diagnosis not present

## 2023-07-26 DIAGNOSIS — M25511 Pain in right shoulder: Secondary | ICD-10-CM | POA: Diagnosis not present

## 2023-07-27 ENCOUNTER — Other Ambulatory Visit: Payer: Self-pay

## 2023-07-27 DIAGNOSIS — N210 Calculus in bladder: Secondary | ICD-10-CM

## 2023-07-27 DIAGNOSIS — N138 Other obstructive and reflux uropathy: Secondary | ICD-10-CM

## 2023-07-28 ENCOUNTER — Ambulatory Visit: Payer: Medicare Other | Admitting: Urology

## 2023-07-28 VITALS — BP 130/77 | HR 82 | Ht 74.0 in | Wt 225.0 lb

## 2023-07-28 DIAGNOSIS — Z125 Encounter for screening for malignant neoplasm of prostate: Secondary | ICD-10-CM

## 2023-07-28 DIAGNOSIS — N138 Other obstructive and reflux uropathy: Secondary | ICD-10-CM | POA: Diagnosis not present

## 2023-07-28 DIAGNOSIS — N401 Enlarged prostate with lower urinary tract symptoms: Secondary | ICD-10-CM

## 2023-07-28 DIAGNOSIS — N5201 Erectile dysfunction due to arterial insufficiency: Secondary | ICD-10-CM

## 2023-07-28 LAB — BLADDER SCAN AMB NON-IMAGING: Scan Result: 0

## 2023-07-28 MED ORDER — SILDENAFIL CITRATE 100 MG PO TABS
ORAL_TABLET | ORAL | 11 refills | Status: AC
Start: 2023-07-28 — End: ?

## 2023-07-28 NOTE — Progress Notes (Signed)
   07/28/2023 10:10 AM   [name] February 02, 1956 [id number]  Reason for visit: Follow up BPH status post HOLEP, ED, PSA screening  HPI: 68 year old male referred from Dr. Retta Diones for Foley dependent urinary retention, BPH, large bladder stone.  He underwent uncomplicated HOLEP and cystolitholapaxy on 10/22/2022 with 100 g of benign tissue removed.  He has been doing very well since that time.  He is urinating with a strong stream and denies any incontinence.  PVR today is normal at 0ml.  He thinks he had a PSA drawn with PCP recently but I do not have access to those records, we discussed expected very low PSA levels after HOLEP, okay to continue screening through age 10-75.  He is using sildenafil 50 to 100 mg on demand with good results, needs refill of that medication.  Sildenafil refilled Continue yearly follow-up for ED symptom check, PSA monitoring, PVR  Sondra Come, MD  [facility] [address] [place], [place] [address] [phone]

## 2023-08-02 ENCOUNTER — Telehealth: Payer: Self-pay

## 2023-08-02 NOTE — Telephone Encounter (Signed)
-----   Message from Sondra Come sent at 08/02/2023  8:09 AM EDT ----- Regarding: PSA results Good news, PSA normal at 1.3, keep follow up as scheduled  Legrand Rams, MD 08/02/2023 ----- Message ----- From: Robbi Garter Sent: 08/01/2023  11:51 AM EDT To: Sondra Come, MD; Debbe Bales, CMA

## 2023-08-02 NOTE — Telephone Encounter (Signed)
 See my chart message

## 2023-08-04 ENCOUNTER — Ambulatory Visit: Payer: Medicare Other | Attending: Internal Medicine | Admitting: Internal Medicine

## 2023-08-04 ENCOUNTER — Encounter: Payer: Self-pay | Admitting: Internal Medicine

## 2023-08-04 VITALS — BP 106/62 | HR 72 | Ht 74.0 in | Wt 232.8 lb

## 2023-08-04 DIAGNOSIS — I4891 Unspecified atrial fibrillation: Secondary | ICD-10-CM | POA: Diagnosis not present

## 2023-08-04 MED ORDER — METOPROLOL TARTRATE 25 MG PO TABS: 12.5000 mg | ORAL_TABLET | Freq: Two times a day (BID) | ORAL | 3 refills | Status: DC

## 2023-08-04 NOTE — Patient Instructions (Signed)
 Medication Instructions:  Your physician has recommended you make the following change in your medication:   -Decrease Metoprolol to 12.5 mg twice daily   *If you need a refill on your cardiac medications before your next appointment, please call your pharmacy*   Lab Work: None If you have labs (blood work) drawn today and your tests are completely normal, you will receive your results only by: MyChart Message (if you have MyChart) OR A paper copy in the mail If you have any lab test that is abnormal or we need to change your treatment, we will call you to review the results.   Testing/Procedures: None   Follow-Up: At [facility], you and your health needs are our priority.  As part of our continuing mission to provide you with exceptional heart care, we have created designated Provider Care Teams.  These Care Teams include your primary Cardiologist (physician) and Advanced Practice Providers (APPs -  Physician Assistants and Nurse Practitioners) who all work together to provide you with the care you need, when you need it.  We recommend signing up for the patient portal called "MyChart".  Sign up information is provided on this After Visit Summary.  MyChart is used to connect with patients for Virtual Visits (Telemedicine).  Patients are able to view lab/test results, encounter notes, upcoming appointments, etc.  Non-urgent messages can be sent to your provider as well.   To learn more about what you can do with MyChart, go to [email or web address].    Your next appointment:   1 year(s)  Provider:   You may see Vishnu P Mallipeddi, MD or one of the following Advanced Practice Providers on your designated Care Team:   [place], PA-C  Jacolyn Reedy, [place]     Other Instructions

## 2023-08-04 NOTE — Progress Notes (Signed)
 Cardiology Office Note  Date: 08/04/2023   ID: [name], DOB December 17, 1955, MRN [id number]  PCP:  Aliene Beams, MD  Cardiologist:  Marjo Bicker, MD Electrophysiologist:  None    History of Present Illness: [name] is a 68 y.o. male with paroxysmal A-fib is here for follow-up visit.  Patient underwent cystolitholapaxy, left ureteral stone extraction and stent removal, HoLEP (holmium laser enucleation of the prostate) on 10/22/2022 for the preop diagnosis of left distal ureteral stone, bladder stones and BPH with obstruction.  He was diagnosed with new onset A-fib recently.  Event monitor from 5/24 showed 6% A-fib burden with the longest episode lasting 20 hours 15 minutes and the symptoms correlated with A-fib.  Sleep study showed moderate OSA.  He has not heard back from Dr. Norris Cross office.  He is here for follow-up visit.  No interval palpitations, severe SOB/DOE, fatigue.  No angina, orthopnea, dizziness, presyncope, syncope or leg swelling.  Compliant with medications.   Past Medical History:  Diagnosis Date   A-fib [facility])    a.) CHA2DS2VASc = 2 (age, vascular disease history);  b.) rate/rhythm maintained on oral metoprolol tartrate; no chronic anticoagulation   Adenomatous polyps    Aortic atherosclerosis (HCC)    Bladder stones    BPH with urinary obstruction    Complication of anesthesia    a.) acute postoperative urinary retention in 07/2022 following foot surgery at [facility] --> required visit to ED and placement of foley catheter x 2 months   Erectile dysfunction    a.) on PDE5i (sildenafil)   Family history of colon cancer    Foot lesion    Hepatic steatosis    History of kidney stones    Liver hemangioma    OSA (obstructive sleep apnea) 10/01/2022   a.) s/p PSG 10/01/2022 --> moderate sleep apnea (AHI 15.7/hr); awaiting CPAP machine   S/P dilatation of esophageal stricture    Syncope and collapse 08/2022   a.) s/p initiation of flomax + finasteride   --> fell and hit head sustaining laceration that ultimately required skin closure with staples    Past Surgical History:  Procedure Laterality Date   BACK SURGERY  2003   lumbar-ruptured disc   BALLOON DILATION  04/06/2023   Procedure: BALLOON DILATION;  Surgeon: Dolores Frame, MD;  Location: [facility];  Service: Gastroenterology;;   BIOPSY  04/06/2023   Procedure: BIOPSY;  Surgeon: Dolores Frame, MD;  [facility]: [facility];  Service: Gastroenterology;;   COLONOSCOPY  06/23/2011   Procedure: COLONOSCOPY;  Surgeon: Malissa Hippo, MD;  Location: [facility];  Service: Endoscopy;  Laterality: N/A;  3:00   COLONOSCOPY N/A 07/15/2016   Procedure: COLONOSCOPY;  Surgeon: Malissa Hippo, MD;  Location: [facility];  Service: Endoscopy;  Laterality: N/A;  830   COLONOSCOPY WITH PROPOFOL N/A 08/27/2021   Procedure: COLONOSCOPY WITH PROPOFOL;  Surgeon: Malissa Hippo, MD;  Location: [facility];  Service: Endoscopy;  Laterality: N/A;  730   CYSTOSCOPY W/ URETERAL STENT PLACEMENT Left 10/16/2022   Procedure: CYSTOSCOPY WITH STENT PLACEMENT;  Surgeon: Despina Arias, MD;  Location: [facility];  Service: Urology;  Laterality: Left;   CYSTOSCOPY WITH LITHOLAPAXY N/A 10/22/2022   Procedure: CYSTOSCOPY WITH LITHOLAPAXY;  Surgeon: Sondra Come, MD;  Location: [facility] OR[facility];  Service: Urology;  Laterality: N/A;   ESOPHAGOGASTRODUODENOSCOPY     ESOPHAGOGASTRODUODENOSCOPY (EGD) WITH PROPOFOL N/A 04/06/2023   Procedure: ESOPHAGOGASTRODUODENOSCOPY (EGD) WITH PROPOFOL;  Surgeon: Levon Hedger  Alisia Ferrari, MD;  Location: [facility];  Service: Gastroenterology;  Laterality: N/A;  11:30am;asa 1   HOLEP-LASER ENUCLEATION OF THE PROSTATE WITH MORCELLATION N/A 10/22/2022   Procedure: HOLEP-LASER ENUCLEATION OF THE PROSTATE WITH MORCELLATION;  Surgeon: Sondra Come, MD;  Location: [facility] OR[facility];  Service: Urology;  Laterality: N/A;   left lytic foot lesion, left cuboid Left 07/2022    [facility]   URETEROSCOPY Left 10/22/2022   Procedure: URETEROSCOPY WITH STENT REMOVAL;  Surgeon: Sondra Come, MD;  Location: [facility] ORS;  Service: Urology;  Laterality: Left;    Current Outpatient Medications  Medication Sig Dispense Refill   apixaban (ELIQUIS) 5 MG TABS tablet Take 1 tablet (5 mg total) by mouth 2 (two) times daily. 60 tablet 11   diphenhydramine-acetaminophen (TYLENOL PM) 25-500 MG TABS tablet Take 1 tablet by mouth at bedtime as needed.     meloxicam (MOBIC) 15 MG tablet 1 daily as needed     omeprazole (PRILOSEC) 20 MG capsule Take 1 capsule (20 mg total) by mouth daily. 90 capsule 3   sildenafil (VIAGRA) 100 MG tablet 1/2 to 1 tab po prn 30 tablet 11   No current facility-administered medications for this visit.   Allergies:  Patient has no known allergies.   Social History: The patient  reports that he has never smoked. He has never been exposed to tobacco smoke. He has never used smokeless tobacco. He reports that he does not drink alcohol and does not use drugs.   Family History: The patient's family history includes Cancer in his father.   ROS:  Please see the history of present illness. Otherwise, complete review of systems is positive for none.  All other systems are reviewed and negative.   Physical Exam: VS:  Ht 6\' 2"  (1.88 m)   Wt 232 lb 12.8 oz (105.6 kg)   BMI 29.89 kg/m , BMI Body mass index is 29.89 kg/m.  Wt Readings from Last 3 Encounters:  08/04/23 232 lb 12.8 oz (105.6 kg)  07/28/23 225 lb (102.1 kg)  04/06/23 220 lb (99.8 kg)    General: Patient appears comfortable at rest. HEENT: Conjunctiva and lids normal, oropharynx clear with moist mucosa. Neck: Supple, no elevated JVP or carotid bruits, no thyromegaly. Lungs: Clear to auscultation, nonlabored breathing at rest. Cardiac: Regular rate and rhythm, no S3 or significant systolic murmur, no pericardial rub. Abdomen: Soft, nontender, no hepatomegaly, bowel sounds present, no guarding or  rebound. Extremities: No pitting edema, distal pulses 2+. Skin: Warm and dry. Musculoskeletal: No kyphosis. Neuropsychiatric: Alert and oriented x3, affect grossly appropriate.  Recent Labwork: 09/22/2022: TSH 3.583 10/16/2022: ALT 24; AST 23; BUN 19; Creatinine, Ser 1.53; Hemoglobin 14.5; Magnesium 2.0; Platelets 228; Potassium 4.5; Sodium 138  No results found for: "CHOL", "TRIG", "HDL", "CHOLHDL", "VLDL", "LDLCALC", "LDLDIRECT"  Other Studies Reviewed Today:   Assessment and Plan:  Paroxysmal A-fib: EKG today showed NSR.  Event monitor from May 2024 showed 6% A-fib burden with the longest episode lasting 20 hours 15 minutes.  Symptoms correlated with atrial fibrillation.  Will continue metoprolol tartrate 12.5 mg twice daily.  CHA2DS2-VASc score is 1 however due to strong family history of atrial fibrillation (brother had A-fib, mother had history of strokes) will continue Eliquis 5 mg twice daily.  Home sleep study showed moderate OSA.  He will need CPAP therapy.  Will reach out to Dr. Norris Cross office.  Moderate OSA: Will reach out to Dr. Norris Cross office for CPAP initiation.   Medication Adjustments/Labs and  Tests Ordered: Current medicines are reviewed at length with the patient today.  Concerns regarding medicines are outlined above.   Tests Ordered: Orders Placed This Encounter  Procedures   EKG 12-Lead    Medication Changes: No orders of the defined types were placed in this encounter.   Disposition:  Follow up 1 year  Signed, Maksym Pfiffner Verne Spurr, MD, 08/04/2023 10:17 AM    [facility] [address], [place], [place] [address]

## 2023-08-05 ENCOUNTER — Telehealth (INDEPENDENT_AMBULATORY_CARE_PROVIDER_SITE_OTHER): Payer: Self-pay | Admitting: Otolaryngology

## 2023-08-05 NOTE — Telephone Encounter (Signed)
 Confirmed w/ patient appt date, time & location -tlt

## 2023-08-08 ENCOUNTER — Encounter (INDEPENDENT_AMBULATORY_CARE_PROVIDER_SITE_OTHER): Payer: Self-pay

## 2023-08-08 ENCOUNTER — Ambulatory Visit (INDEPENDENT_AMBULATORY_CARE_PROVIDER_SITE_OTHER): Payer: Medicare Other | Admitting: Otolaryngology

## 2023-08-08 VITALS — BP 103/69 | HR 62 | Ht 74.0 in | Wt 225.0 lb

## 2023-08-08 DIAGNOSIS — H6123 Impacted cerumen, bilateral: Secondary | ICD-10-CM | POA: Diagnosis not present

## 2023-08-08 NOTE — Progress Notes (Signed)
 Patient ID: [name], male   DOB: Feb 18, 1956, 68 y.o.   MRN: [id number]  Follow-up: Recurrent cerumen impaction.  Procedure: Bilateral cerumen disimpaction.    Indication: Cerumen impaction, resulting in ear discomfort and conductive hearing loss.    Description: The patient is placed supine on the exam table.  Under the operating microscope, the right ear canal is examined and is noted to be completely impacted with cerumen.  The cerumen is carefully removed with a combination of suction catheters, cerumen curette, and alligator forceps.  After the cerumen removal, the ear canal and tympanic membrane are noted to be normal.  No middle ear effusion is noted.  The same procedure is then repeated on the left side without exception. The patient tolerated the procedure well.  Follow-up care:  The patient is instructed not to use Q-tips to clean the ear canals.  The patient will follow up in 5 months.

## 2023-08-09 ENCOUNTER — Other Ambulatory Visit: Payer: Self-pay

## 2023-08-09 DIAGNOSIS — G4733 Obstructive sleep apnea (adult) (pediatric): Secondary | ICD-10-CM

## 2023-08-09 DIAGNOSIS — I4891 Unspecified atrial fibrillation: Secondary | ICD-10-CM

## 2023-08-09 DIAGNOSIS — I480 Paroxysmal atrial fibrillation: Secondary | ICD-10-CM

## 2023-08-09 DIAGNOSIS — R55 Syncope and collapse: Secondary | ICD-10-CM

## 2023-08-09 DIAGNOSIS — R42 Dizziness and giddiness: Secondary | ICD-10-CM

## 2023-08-09 NOTE — Progress Notes (Signed)
 Order for CPAP Titration ordered today, per Dr. Jenene Slicker

## 2023-09-16 DIAGNOSIS — E781 Pure hyperglyceridemia: Secondary | ICD-10-CM | POA: Diagnosis not present

## 2023-10-12 ENCOUNTER — Other Ambulatory Visit: Payer: Self-pay | Admitting: Internal Medicine

## 2023-10-20 DIAGNOSIS — M19072 Primary osteoarthritis, left ankle and foot: Secondary | ICD-10-CM | POA: Diagnosis not present

## 2023-10-20 DIAGNOSIS — M85572 Aneurysmal bone cyst, left ankle and foot: Secondary | ICD-10-CM | POA: Diagnosis not present

## 2023-10-20 DIAGNOSIS — M7732 Calcaneal spur, left foot: Secondary | ICD-10-CM | POA: Diagnosis not present

## 2023-10-20 DIAGNOSIS — M85579 Aneurysmal bone cyst, unspecified ankle and foot: Secondary | ICD-10-CM | POA: Diagnosis not present

## 2023-10-20 NOTE — Progress Notes (Signed)
 Review of Systems  All other systems reviewed and are negative.  According to patient intake ROS form.

## 2023-10-25 DIAGNOSIS — K080 Exfoliation of teeth due to systemic causes: Secondary | ICD-10-CM | POA: Diagnosis not present

## 2023-11-15 ENCOUNTER — Other Ambulatory Visit: Payer: Self-pay | Admitting: Internal Medicine

## 2023-11-15 DIAGNOSIS — I480 Paroxysmal atrial fibrillation: Secondary | ICD-10-CM

## 2023-11-15 NOTE — Telephone Encounter (Signed)
 Eliquis  5mg  refill request received. Patient is 68 years old, weight-102.1kg, Crea-1.21 on via scanned labs from pcp, Diagnosis-Afib, and last seen by Dr. Mallipeddi on 08/04/23 . Dose is appropriate based on dosing criteria. Will send in refill to requested pharmacy.

## 2023-11-17 DIAGNOSIS — I480 Paroxysmal atrial fibrillation: Secondary | ICD-10-CM | POA: Diagnosis not present

## 2023-11-30 ENCOUNTER — Encounter (INDEPENDENT_AMBULATORY_CARE_PROVIDER_SITE_OTHER): Payer: Self-pay | Admitting: Internal Medicine

## 2023-11-30 DIAGNOSIS — I480 Paroxysmal atrial fibrillation: Secondary | ICD-10-CM

## 2023-12-05 ENCOUNTER — Telehealth: Payer: Self-pay

## 2023-12-05 ENCOUNTER — Encounter: Payer: Self-pay | Admitting: Internal Medicine

## 2023-12-05 NOTE — Telephone Encounter (Signed)
**Note De-Identified Jordana Dugue Obfuscation** Per the [facility] Provider Portal: Service Date: 01/23/2024   The following solutions for the service date entered do not require Pre-Authorization by [employer]: Sleep Management-CPT Code: 04188 (CPAP Titration).  I have transferred the order to the sleep lab.

## 2023-12-06 ENCOUNTER — Other Ambulatory Visit: Payer: Self-pay

## 2023-12-06 MED ORDER — METOPROLOL TARTRATE 25 MG PO TABS: 12.5000 mg | ORAL_TABLET | Freq: Two times a day (BID) | ORAL | 3 refills | Status: DC

## 2023-12-16 DIAGNOSIS — I480 Paroxysmal atrial fibrillation: Secondary | ICD-10-CM | POA: Diagnosis not present

## 2023-12-20 DIAGNOSIS — E781 Pure hyperglyceridemia: Secondary | ICD-10-CM | POA: Diagnosis not present

## 2023-12-22 DIAGNOSIS — I480 Paroxysmal atrial fibrillation: Secondary | ICD-10-CM | POA: Diagnosis not present

## 2023-12-22 DIAGNOSIS — N401 Enlarged prostate with lower urinary tract symptoms: Secondary | ICD-10-CM | POA: Diagnosis not present

## 2023-12-22 DIAGNOSIS — E781 Pure hyperglyceridemia: Secondary | ICD-10-CM | POA: Diagnosis not present

## 2023-12-23 NOTE — Telephone Encounter (Signed)
Please see the MyChart message reply(ies) for my assessment and plan.    This patient gave consent for this Medical Advice Message and is aware that it may result in a bill to their insurance company, as well as the possibility of receiving a bill for a co-payment or deductible. They are an established patient, but are not seeking medical advice exclusively about a problem treated during an in person or video visit in the last seven days. I did not recommend an in person or video visit within seven days of my reply.    I spent a total of 5 minutes cumulative time within 7 days through MyChart messaging.  Vishnu P Mallipeddi, MD   

## 2023-12-29 DIAGNOSIS — L918 Other hypertrophic disorders of the skin: Secondary | ICD-10-CM | POA: Diagnosis not present

## 2023-12-29 DIAGNOSIS — L821 Other seborrheic keratosis: Secondary | ICD-10-CM | POA: Diagnosis not present

## 2024-01-15 DIAGNOSIS — I480 Paroxysmal atrial fibrillation: Secondary | ICD-10-CM | POA: Diagnosis not present

## 2024-01-18 ENCOUNTER — Encounter: Payer: Self-pay | Admitting: Urology

## 2024-01-19 DIAGNOSIS — M7732 Calcaneal spur, left foot: Secondary | ICD-10-CM | POA: Diagnosis not present

## 2024-01-19 DIAGNOSIS — M85579 Aneurysmal bone cyst, unspecified ankle and foot: Secondary | ICD-10-CM | POA: Diagnosis not present

## 2024-01-19 DIAGNOSIS — M19072 Primary osteoarthritis, left ankle and foot: Secondary | ICD-10-CM | POA: Diagnosis not present

## 2024-01-22 DIAGNOSIS — E781 Pure hyperglyceridemia: Secondary | ICD-10-CM | POA: Diagnosis not present

## 2024-01-22 DIAGNOSIS — I480 Paroxysmal atrial fibrillation: Secondary | ICD-10-CM | POA: Diagnosis not present

## 2024-01-22 DIAGNOSIS — N401 Enlarged prostate with lower urinary tract symptoms: Secondary | ICD-10-CM | POA: Diagnosis not present

## 2024-01-29 ENCOUNTER — Ambulatory Visit (HOSPITAL_BASED_OUTPATIENT_CLINIC_OR_DEPARTMENT_OTHER): Attending: Internal Medicine | Admitting: Cardiology

## 2024-01-29 DIAGNOSIS — I4891 Unspecified atrial fibrillation: Secondary | ICD-10-CM

## 2024-01-29 DIAGNOSIS — G4733 Obstructive sleep apnea (adult) (pediatric): Secondary | ICD-10-CM | POA: Insufficient documentation

## 2024-01-29 DIAGNOSIS — R42 Dizziness and giddiness: Secondary | ICD-10-CM | POA: Insufficient documentation

## 2024-01-29 DIAGNOSIS — I480 Paroxysmal atrial fibrillation: Secondary | ICD-10-CM | POA: Diagnosis not present

## 2024-01-29 DIAGNOSIS — R55 Syncope and collapse: Secondary | ICD-10-CM | POA: Diagnosis not present

## 2024-02-01 NOTE — Procedures (Signed)
  Indications for Polysomnography The patient is a 68 year old Male who is 6' 2 and weighs 225.0 lbs. His BMI equals 28.9.  A full night titration treatment study was performed.  No medication was taken.No Data. Polysomnogram Data A full night polysomnogram recorded the standard physiologic parameters including EEG, EOG, EMG, EKG, nasal and oral airflow.  Respiratory parameters of chest and abdominal movements were recorded with Respiratory Inductance Plethysmography belts.   Oxygen saturation was recorded by pulse oximetry.  Sleep Architecture The total recording time of the polysomnogram was 372.6 minutes.  The total sleep time was 267.5 minutes.  The patient spent 4.7% of total sleep time in Stage N1, 68.4% in Stage N2, 14.0% in Stages N3, and 12.9% in REM.  Sleep latency was 6.3 minutes.   REM latency was 155.5 minutes.  Sleep Efficiency was 71.8%.  Wake after Sleep Onset time was 98.5 minutes.  Titration Summary The patient was titrated at pressures ranging from 6 cm/H20 up to 10 cm/H20. The last pressure used in the study was 10 cm/H20.  Respiratory Events The polysomnogram revealed a presence of 0 obstructive, 0 central, and 0 mixed apneas resulting in an Apnea index of 0 events per hour.  There were 11 hypopneas (GreaterEqual to3% desaturation and/or arousal) resulting in an Apnea\Hypopnea Index (AHI  GreaterEqual to3% desaturation and/or arousal) of 2.5 events per hour.  There were 6 hypopneas (GreaterEqual to4% desaturation) resulting in an Apnea\Hypopnea Index (AHI GreaterEqual to4% desaturation) of 1.3 events per hour.  There were 1 Respiratory  Effort Related Arousals resulting in a RERA index of 0.2 events per hour. The Respiratory Disturbance Index is 2.7 events per hour.  The snore index was 0 events per hour.  Mean oxygen saturation was 95.1%.  The lowest oxygen saturation during sleep was 88.0%.  Time spent LessEqual to88% oxygen saturation was  minutes.  Limb Activity There  were 281 limb movements recorded.  Of this total, 271 were classified as PLMs.  Of the PLMs, 23 were associated with arousals.  The Limb Movement index was 63.0 per hour while the PLM index was 60.8 per hour.  Cardiac Summary The average pulse rate was 55.3 bpm.  The minimum pulse rate was 50.0 bpm while the maximum pulse rate was 78.0 bpm.  Cardiac rhythm was normal with rare PVCs.  Diagnosis: Obstructive Sleep Apnea Successful CPAP titration  Recommendations: 1.  Recommend a trial of ResMed CPAP at 10cm H2O with heated humidity and medium Airfit F20 mask. 2. Close follow-up is necessary to ensure success with CPAP or oral appliance therapy for maximum benefit. 3. A follow-up oximetry study on CPAP is recommended to assess the adequacy of therapy and determine the need for supplemental oxygen or the potential need for Bi-level therapy.  An arterial blood gas to determine the adequacy of baseline ventilation and  oxygenation should also be considered. 4. Healthy sleep recommendations include:  adequate nightly sleep (normal 7-9 hrs/night), avoidance of caffeine after noon and alcohol near bedtime, and maintaining a sleep environment that is cool, dark and quiet. 5. Weight loss for overweight patients is recommended.  Even modest amounts of weight loss can significantly improve the severity of sleep apnea. 6. Snoring recommendations include:  weight loss where appropriate, side sleeping, and avoidance of alcohol before bed. 7. Operation of motor vehicle should be avoided when sleepy.    This study was personally reviewed and electronically signed by: Shlomo Corning, MD Accredited Board Certified in Sleep Medicine Date/Time: 02/01/2024 4:27PM

## 2024-02-08 ENCOUNTER — Encounter (INDEPENDENT_AMBULATORY_CARE_PROVIDER_SITE_OTHER): Payer: Self-pay | Admitting: Otolaryngology

## 2024-02-08 ENCOUNTER — Ambulatory Visit (INDEPENDENT_AMBULATORY_CARE_PROVIDER_SITE_OTHER): Admitting: Otolaryngology

## 2024-02-08 VITALS — BP 145/84 | HR 64 | Temp 97.9°F

## 2024-02-08 DIAGNOSIS — H6123 Impacted cerumen, bilateral: Secondary | ICD-10-CM | POA: Diagnosis not present

## 2024-02-08 DIAGNOSIS — H5203 Hypermetropia, bilateral: Secondary | ICD-10-CM | POA: Diagnosis not present

## 2024-02-08 NOTE — Progress Notes (Signed)
 Patient ID: [name], male   DOB: [birth year], 68 y.o.   MRN: [id number]  Procedure: Bilateral cerumen disimpaction.   Indication: Cerumen impaction, resulting in ear discomfort and conductive hearing loss.   Description: The patient is placed supine on the operating table. Under the operating microscope, the right ear canal is examined and is noted to be impacted with cerumen. The cerumen is carefully removed with a combination of suction catheters, cerumen curette, and alligator forceps. After the cerumen removal, the ear canal and tympanic membrane are noted to be normal. No middle ear effusion is noted. The same procedure is then repeated on the left side without exception. The patient tolerated the procedure well.  Follow-up care:  The patient is instructed not to use Q-tips to clean the ear canals. The patient will follow up in 5 months.

## 2024-02-10 ENCOUNTER — Telehealth: Payer: Self-pay | Admitting: *Deleted

## 2024-02-10 NOTE — Telephone Encounter (Signed)
The patient has been notified of the result. Left detailed message on voicemail and informed patient to call back.

## 2024-02-10 NOTE — Telephone Encounter (Signed)
-----   Message from Wilbert Bihari sent at 02/01/2024  4:28 PM EDT ----- Please let patient know that they had a successful PAP titration and let DME know that orders are in EPIC.  Please set up 6 week OV with me.

## 2024-02-14 DIAGNOSIS — I480 Paroxysmal atrial fibrillation: Secondary | ICD-10-CM | POA: Diagnosis not present

## 2024-02-21 ENCOUNTER — Encounter: Payer: Self-pay | Admitting: *Deleted

## 2024-02-21 DIAGNOSIS — E781 Pure hyperglyceridemia: Secondary | ICD-10-CM | POA: Diagnosis not present

## 2024-02-21 DIAGNOSIS — N401 Enlarged prostate with lower urinary tract symptoms: Secondary | ICD-10-CM | POA: Diagnosis not present

## 2024-02-21 DIAGNOSIS — I480 Paroxysmal atrial fibrillation: Secondary | ICD-10-CM | POA: Diagnosis not present

## 2024-03-06 ENCOUNTER — Other Ambulatory Visit: Payer: Self-pay

## 2024-03-06 ENCOUNTER — Ambulatory Visit
Admission: RE | Admit: 2024-03-06 | Discharge: 2024-03-06 | Disposition: A | Discharge status: Home / Self Care | Source: Ambulatory Visit | Attending: Family Medicine | Admitting: Family Medicine

## 2024-03-06 VITALS — BP 127/88 | HR 72 | Temp 98.2°F | Resp 20

## 2024-03-06 DIAGNOSIS — J069 Acute upper respiratory infection, unspecified: Secondary | ICD-10-CM | POA: Diagnosis not present

## 2024-03-06 LAB — POC COVID19/FLU A&B COMBO
Covid Antigen, POC: NEGATIVE
Influenza A Antigen, POC: NEGATIVE
Influenza B Antigen, POC: NEGATIVE

## 2024-03-06 LAB — POCT RAPID STREP A (OFFICE): Rapid Strep A Screen: NEGATIVE

## 2024-03-06 MED ORDER — PROMETHAZINE-DM 6.25-15 MG/5ML PO SYRP: 5.0000 mL | ORAL_SOLUTION | Freq: Four times a day (QID) | ORAL | 0 refills | Status: AC | PRN

## 2024-03-06 MED ORDER — AZELASTINE HCL 0.1 % NA SOLN: 1.0000 | Freq: Two times a day (BID) | NASAL | 0 refills | Status: AC

## 2024-03-06 NOTE — ED Triage Notes (Signed)
 Pt reports cough, sore throat, headache, intermittent fever, nausea x4 days. Reports spouse has had something similar.

## 2024-03-06 NOTE — ED Provider Notes (Signed)
 [facility]    CSN: [id number] Arrival date & time: 03/06/24  1643      History   Chief Complaint Chief Complaint  Patient presents with   Cough    Respiratory problem, nausea, severe sore throat, headaches, no appetite for last 4 days. - Entered by patient    HPI [name] is a 68 y.o. male.   Presenting today with 4-day history of intermittent fevers, nausea, sore throat, cough, congestion, headache.  Denies chest pain, shortness of breath, abdominal pain, vomiting, diarrhea.  So far trying over-the-counter remedies with minimal relief.  No known history of chronic pulmonary disease.  Wife recently sick with similar symptoms.    Past Medical History:  Diagnosis Date   A-fib [facility])    a.) CHA2DS2VASc = 2 (age, vascular disease history);  b.) rate/rhythm maintained on oral metoprolol  tartrate; no chronic anticoagulation   Adenomatous polyps    Aortic atherosclerosis    Bladder stones    BPH with urinary obstruction    Complication of anesthesia    a.) acute postoperative urinary retention in 07/2022 following foot surgery at [facility] --> required visit to ED and placement of foley catheter x 2 months   Erectile dysfunction    a.) on PDE5i (sildenafil )   Family history of colon cancer    Foot lesion    Hepatic steatosis    History of kidney stones    Liver hemangioma    OSA (obstructive sleep apnea) 10/01/2022   a.) s/p PSG 10/01/2022 --> moderate sleep apnea (AHI 15.7/hr); awaiting CPAP machine   S/P dilatation of esophageal stricture    Syncope and collapse 08/2022   a.) s/p initiation of flomax  + finasteride   --> fell and hit head sustaining laceration that ultimately required skin closure with staples    Patient Active Problem List   Diagnosis Date Noted   Dysphagia 03/14/2023   Liver hemangioma 03/14/2023   Impacted cerumen of both ears 03/11/2023   Obstructive uropathy 10/15/2022   AKI (acute kidney injury) 10/15/2022   BPH (benign prostatic  hyperplasia) 10/15/2022   Acute pyelonephritis 10/15/2022   Paroxysmal A-fib (HCC) 09/22/2022   Dizziness 09/22/2022   Syncope and collapse 09/22/2022   Preop cardiovascular exam 09/22/2022   OSA (obstructive sleep apnea) 09/22/2022   Family hx of colon cancer 06/11/2016   Hx of adenomatous colonic polyps 06/11/2016    Past Surgical History:  Procedure Laterality Date   BACK SURGERY  2003   lumbar-ruptured disc   BALLOON DILATION  04/06/2023   Procedure: BALLOON DILATION;  Surgeon: Eartha Angelia Sieving, MD;  [facility];  Service: Gastroenterology;;   BIOPSY  04/06/2023   Procedure: BIOPSY;  Surgeon: Eartha Angelia Sieving, MD;  [facility]: [facility];  Service: Gastroenterology;;   COLONOSCOPY  06/23/2011   Procedure: COLONOSCOPY;  Surgeon: Claudis RAYMOND Rivet, MD;  [facility]: [facility];  Service: Endoscopy;  Laterality: N/A;  3:00   COLONOSCOPY N/A 07/15/2016   Procedure: COLONOSCOPY;  Surgeon: Claudis RAYMOND Rivet, MD;  [facility]: [facility];  Service: Endoscopy;  Laterality: N/A;  830   COLONOSCOPY WITH PROPOFOL  N/A 08/27/2021   Procedure: COLONOSCOPY WITH PROPOFOL ;  Surgeon: Rivet Claudis RAYMOND, MD;  [facility]: [facility];  Service: Endoscopy;  Laterality: N/A;  730   CYSTOSCOPY W/ URETERAL STENT PLACEMENT Left 10/16/2022   Procedure: CYSTOSCOPY WITH STENT PLACEMENT;  Surgeon: Lovie Arlyss CROME, MD;  Location: [facility];  Service: Urology;  Laterality: Left;   CYSTOSCOPY WITH LITHOLAPAXY  N/A 10/22/2022   Procedure: CYSTOSCOPY WITH LITHOLAPAXY;  Surgeon: Francisca Redell BROCKS, MD;  Location: [facility];  Service: Urology;  Laterality: N/A;   ESOPHAGOGASTRODUODENOSCOPY     ESOPHAGOGASTRODUODENOSCOPY (EGD) WITH PROPOFOL  N/A 04/06/2023   Procedure: ESOPHAGOGASTRODUODENOSCOPY (EGD) WITH PROPOFOL ;  Surgeon: Eartha Angelia Sieving, MD;  Location: [facility];  Service: Gastroenterology;  Laterality: N/A;  11:30am;asa 1   HOLEP-LASER ENUCLEATION OF THE PROSTATE WITH MORCELLATION  N/A 10/22/2022   Procedure: HOLEP-LASER ENUCLEATION OF THE PROSTATE WITH MORCELLATION;  Surgeon: Francisca Redell BROCKS, MD;  Location: [facility];  Service: Urology;  Laterality: N/A;   left lytic foot lesion, left cuboid Left 07/2022   [facility]   URETEROSCOPY Left 10/22/2022   Procedure: URETEROSCOPY WITH STENT REMOVAL;  Surgeon: Francisca Redell BROCKS, MD;  Location: [facility];  Service: Urology;  Laterality: Left;       Home Medications    Prior to Admission medications   Medication Sig Start Date End Date Taking? Authorizing Provider  azelastine (ASTELIN) 0.1 % nasal spray Place 1 spray into both nostrils 2 (two) times daily. Use in each nostril as directed 03/06/24  Yes Stuart Vernell Norris, PA-C  promethazine -dextromethorphan (PROMETHAZINE -DM) 6.25-15 MG/5ML syrup Take 5 mLs by mouth 4 (four) times daily as needed. 03/06/24  Yes Stuart Vernell Norris, PA-C  apixaban  (ELIQUIS ) 5 MG TABS tablet TAKE ONE TABLET (5MG  TOTAL) BY MOUTH TWOTIMES DAILY 11/15/23   Mallipeddi, Vishnu P, MD  diphenhydramine -acetaminophen  (TYLENOL  PM) 25-500 MG TABS tablet Take 1 tablet by mouth at bedtime as needed.    [provider]  meloxicam (MOBIC) 15 MG tablet 1 daily as needed 07/19/23   [provider]  metoprolol  tartrate (LOPRESSOR ) 25 MG tablet Take 0.5 tablets (12.5 mg total) by mouth 2 (two) times daily. 12/06/23   Mallipeddi, Vishnu P, MD  omeprazole  (PRILOSEC) 20 MG capsule Take 1 capsule (20 mg total) by mouth daily. 04/06/23   Castaneda Mayorga, Daniel, MD  sildenafil  (VIAGRA ) 100 MG tablet 1/2 to 1 tab po prn 07/28/23   Francisca Redell BROCKS, MD    Family History Family History  Problem Relation Age of Onset   Cancer Father     Social History Social History   Tobacco Use   Smoking status: Never    Passive exposure: Never   Smokeless tobacco: Never  Vaping Use   Vaping status: Never Used  Substance Use Topics   Alcohol use: No    Comment: occasional glass of wine   Drug use: No      Allergies   Patient has no known allergies.   Review of Systems Review of Systems Per HPI  Physical Exam Triage Vital Signs ED Triage Vitals  Encounter Vitals Group     BP 03/06/24 1652 127/88     Girls Systolic BP Percentile --      Girls Diastolic BP Percentile --      Boys Systolic BP Percentile --      Boys Diastolic BP Percentile --      Pulse Rate 03/06/24 1652 72     Resp 03/06/24 1652 20     Temp 03/06/24 1652 98.2 F (36.8 C)     Temp Source 03/06/24 1652 Oral     SpO2 03/06/24 1652 96 %     Weight --      Height --      Head Circumference --      Peak Flow --      Pain Score 03/06/24 1655 5  Pain Loc --      Pain Education --      Exclude from Growth Chart --    No data found.  Updated Vital Signs BP 127/88 (BP Location: Right Arm)   Pulse 72   Temp 98.2 F (36.8 C) (Oral)   Resp 20   SpO2 96%   Visual Acuity Right Eye Distance:   Left Eye Distance:   Bilateral Distance:    Right Eye Near:   Left Eye Near:    Bilateral Near:     Physical Exam Vitals and nursing note reviewed.  Constitutional:      Appearance: He is well-developed.  HENT:     Head: Atraumatic.     Right Ear: Tympanic membrane and external ear normal.     Left Ear: Tympanic membrane and external ear normal.     Nose: Rhinorrhea present.     Mouth/Throat:     Pharynx: Posterior oropharyngeal erythema present. No oropharyngeal exudate.  Eyes:     Conjunctiva/sclera: Conjunctivae normal.     Pupils: Pupils are equal, round, and reactive to light.  Cardiovascular:     Rate and Rhythm: Normal rate and regular rhythm.  Pulmonary:     Effort: Pulmonary effort is normal. No respiratory distress.     Breath sounds: No wheezing or rales.  Musculoskeletal:        General: Normal range of motion.     Cervical back: Normal range of motion and neck supple.  Lymphadenopathy:     Cervical: No cervical adenopathy.  Skin:    General: Skin is warm and dry.  Neurological:      Mental Status: He is alert and oriented to person, place, and time.  Psychiatric:        Behavior: Behavior normal.      UC Treatments / Results  Labs (all labs ordered are listed, but only abnormal results are displayed) Labs Reviewed  POC COVID19/FLU A&B COMBO  POCT RAPID STREP A (OFFICE)    EKG   Radiology No results found.  Procedures Procedures (including critical care time)  Medications Ordered in UC Medications - No data to display  Initial Impression / Assessment and Plan / UC Course  I have reviewed the triage vital signs and the nursing notes.  Pertinent labs & imaging results that were available during my care of the patient were reviewed by me and considered in my medical decision making (see chart for details).     Rapid strep, flu, COVID all negative, vitals and exam reassuring today.  Suspect viral respiratory infection.  Will treat with Phenergan  DM, Astelin, supportive over-the-counter medications and home care.  Return for worsening symptoms.  Final Clinical Impressions(s) / UC Diagnoses   Final diagnoses:  Viral URI with cough     Discharge Instructions      In addition to the prescribed medications, you may take Coricidin HBP, plain Mucinex, saline sinus rinses, use humidifiers, Tylenol  as needed.  Follow-up for worsening or unresolving symptoms.  Your vital signs and exam are very reassuring today, your COVID and flu testing was negative.    ED Prescriptions     Medication Sig Dispense Auth. Provider   promethazine -dextromethorphan (PROMETHAZINE -DM) 6.25-15 MG/5ML syrup Take 5 mLs by mouth 4 (four) times daily as needed. 100 mL Stuart Vernell Norris, PA-C   azelastine (ASTELIN) 0.1 % nasal spray Place 1 spray into both nostrils 2 (two) times daily. Use in each nostril as directed 30 mL Stuart Vernell Norris, PA-C  PDMP not reviewed this encounter.   Stuart Vernell Norris, [place] 03/06/24 [phone]

## 2024-03-06 NOTE — Discharge Instructions (Signed)
 In addition to the prescribed medications, you may take Coricidin HBP, plain Mucinex, saline sinus rinses, use humidifiers, Tylenol  as needed.  Follow-up for worsening or unresolving symptoms.  Your vital signs and exam are very reassuring today, your COVID and flu testing was negative.

## 2024-03-07 ENCOUNTER — Encounter (INDEPENDENT_AMBULATORY_CARE_PROVIDER_SITE_OTHER): Payer: Self-pay | Admitting: Gastroenterology

## 2024-03-15 DIAGNOSIS — I480 Paroxysmal atrial fibrillation: Secondary | ICD-10-CM | POA: Diagnosis not present

## 2024-03-19 ENCOUNTER — Telehealth: Payer: Self-pay | Admitting: Internal Medicine

## 2024-03-19 NOTE — Telephone Encounter (Signed)
*  STAT* If patient is at the pharmacy, call can be transferred to refill team.   1. Which medications need to be refilled? (please list name of each medication and dose if known) metoprolol  tartrate (LOPRESSOR ) 25 MG tablet    2. Would you like to learn more about the convenience, safety, & potential cost savings by using the [facility]? No    3. Are you open to using the [facility] (Type [facility].No   4. Which pharmacy/location (including street and city if local pharmacy) is medication to be sent to? [facility] - [place], [place] - [address]     5. Do they need a 30 day or 90 day supply? 90 day    Pt c/o medication issue:  1. Name of Medication: metoprolol  tartrate (LOPRESSOR ) 25 MG tablet   2. How are you currently taking this medication (dosage and times per day)? Take 0.5 tablets (12.5 mg total) by mouth 2 (two) times daily.   3. Are you having a reaction (difficulty breathing--STAT)? No  4. What is your medication issue? Pts dosage was increased to 1 pill twice daily and he needs his new Rx to reflect that. Please advise.

## 2024-03-19 NOTE — Telephone Encounter (Signed)
 Called patient back about message. Patient stated that Dr. Mallipeddi, back in August, told him to take Metoprolol  tartrate 25 mg BID instead of 12.5 mg BID. Could not find this in chart, but informed patient that a message would be sent to Dr. Mallipeddi to see if it is okay to send in what he is requesting Metoprolol  25 mg BID.

## 2024-03-22 MED ORDER — METOPROLOL TARTRATE 25 MG PO TABS: 25.0000 mg | ORAL_TABLET | Freq: Two times a day (BID) | ORAL | 1 refills | Status: AC

## 2024-03-22 NOTE — Telephone Encounter (Signed)
 Per pt advice request, 11/30/23, Dr. Mallipeddi advised pt to continue Metoprolol  25 bid.  Refill sent to [facility].

## 2024-03-22 NOTE — Telephone Encounter (Signed)
 Patient would like prescription updated for the 25 MG and sent to pharmacy if possible.

## 2024-03-23 DIAGNOSIS — E781 Pure hyperglyceridemia: Secondary | ICD-10-CM | POA: Diagnosis not present

## 2024-03-23 DIAGNOSIS — N401 Enlarged prostate with lower urinary tract symptoms: Secondary | ICD-10-CM | POA: Diagnosis not present

## 2024-03-23 DIAGNOSIS — I480 Paroxysmal atrial fibrillation: Secondary | ICD-10-CM | POA: Diagnosis not present

## 2024-04-09 ENCOUNTER — Other Ambulatory Visit (INDEPENDENT_AMBULATORY_CARE_PROVIDER_SITE_OTHER): Payer: Self-pay | Admitting: Gastroenterology

## 2024-04-14 DIAGNOSIS — I480 Paroxysmal atrial fibrillation: Secondary | ICD-10-CM | POA: Diagnosis not present

## 2024-04-22 DIAGNOSIS — E781 Pure hyperglyceridemia: Secondary | ICD-10-CM | POA: Diagnosis not present

## 2024-04-22 DIAGNOSIS — N401 Enlarged prostate with lower urinary tract symptoms: Secondary | ICD-10-CM | POA: Diagnosis not present

## 2024-04-22 DIAGNOSIS — I480 Paroxysmal atrial fibrillation: Secondary | ICD-10-CM | POA: Diagnosis not present

## 2024-05-03 DIAGNOSIS — M7732 Calcaneal spur, left foot: Secondary | ICD-10-CM | POA: Diagnosis not present

## 2024-05-03 DIAGNOSIS — M19072 Primary osteoarthritis, left ankle and foot: Secondary | ICD-10-CM | POA: Diagnosis not present

## 2024-05-03 DIAGNOSIS — M85572 Aneurysmal bone cyst, left ankle and foot: Secondary | ICD-10-CM | POA: Diagnosis not present

## 2024-05-03 DIAGNOSIS — M85579 Aneurysmal bone cyst, unspecified ankle and foot: Secondary | ICD-10-CM | POA: Diagnosis not present

## 2024-05-11 ENCOUNTER — Other Ambulatory Visit: Payer: Self-pay | Admitting: Internal Medicine

## 2024-05-11 DIAGNOSIS — I480 Paroxysmal atrial fibrillation: Secondary | ICD-10-CM

## 2024-05-11 NOTE — Telephone Encounter (Signed)
 Prescription refill request for Eliquis  received. Indication: A-Fib Last office visit: 08/04/23 Scr:  Age: 68 Weight: 102.1 KG

## 2024-05-14 NOTE — Telephone Encounter (Signed)
 Prescription refill request for Eliquis  received. Indication: A-Fib Last office visit: 08/04/23 Scr: 1.53 on 10/15/22 Age: 68 Weight: 102.1 KG   Based on above findings Eliquis  5mg  twice daily is the appropriate dose.  Pt is past due for labs.  Requested they be done at upcoming appt with Dr Mallipeddi.  Message sent to schedulers to make MD appt.  Refill approved.

## 2024-07-12 ENCOUNTER — Ambulatory Visit (INDEPENDENT_AMBULATORY_CARE_PROVIDER_SITE_OTHER): Admitting: Otolaryngology

## 2024-07-25 ENCOUNTER — Ambulatory Visit: Admitting: Urology

## 2024-07-26 ENCOUNTER — Ambulatory Visit: Admitting: Urology
